# Patient Record
Sex: Male | Born: 1940 | Race: White | Hispanic: No | Marital: Married
Health system: Midwestern US, Community
[De-identification: ages and names within clinical notes are randomized; demographics above are authoritative.]

---

## 2017-05-30 IMAGING — CT CT Maxillofacial Area W-O Contrast
1 series · 15 of 30 positions shown, 19 images · non-contrast
Comparison: None

CT Maxillofacial Area W-O Contrast
INDICATION: Trauma.                                                                      
 Pertinent History: Ground level fall. Patient landed on his face hitting his nose.        
 Surgical History:                                                                         
 Cancer: None                                                                              
 Comments: None
TECHNIQUE: Helical acquisition with sagittal and coronal reformations.                    
 Utilized dose reduction techniques include: Automated Exposure Control, vendor specific   
 iterative reconstruction technique

[Series 7: ax pre st facial bones · axial · non-contrast · 0.43mm/px · z∈[+1279,+1441]mm · 15 of 89 slices shown, 19 images]
[im 4/89  brain]
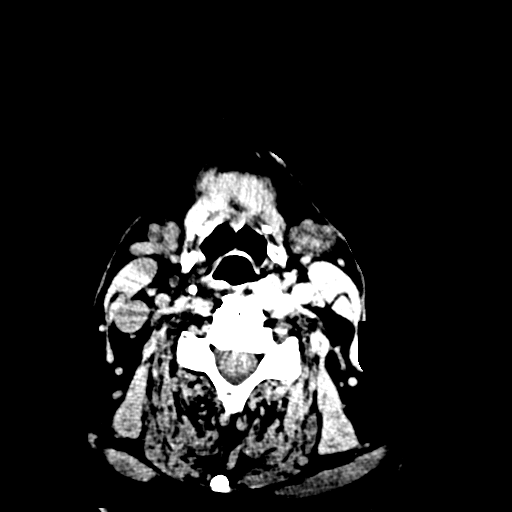
[im 4/89  bone]
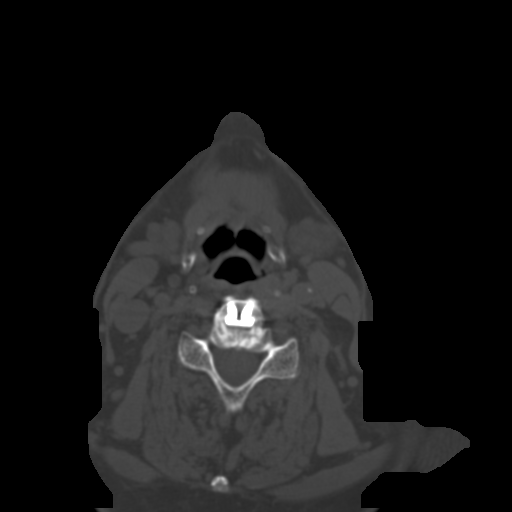
[im 10/89  bone]
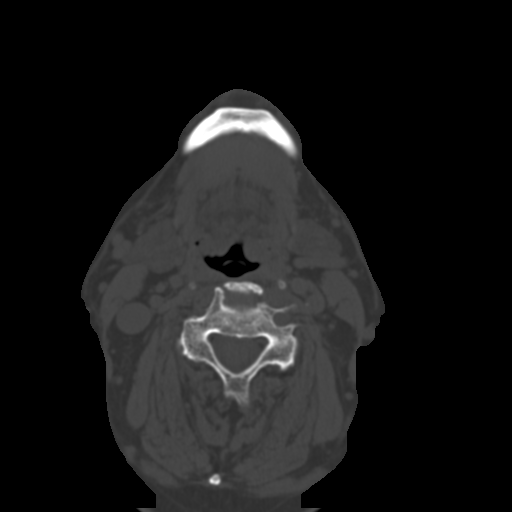
[im 16/89  bone]
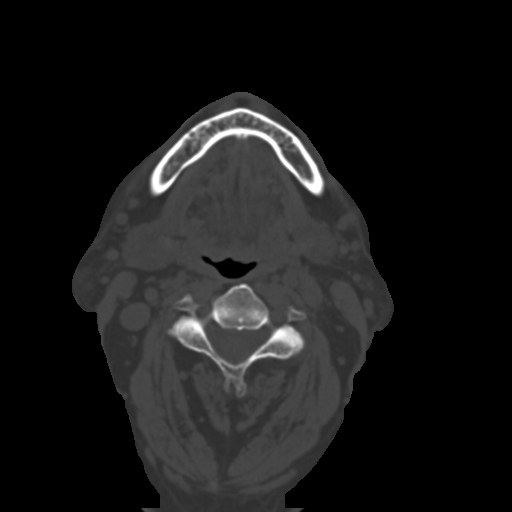
[im 22/89  bone]
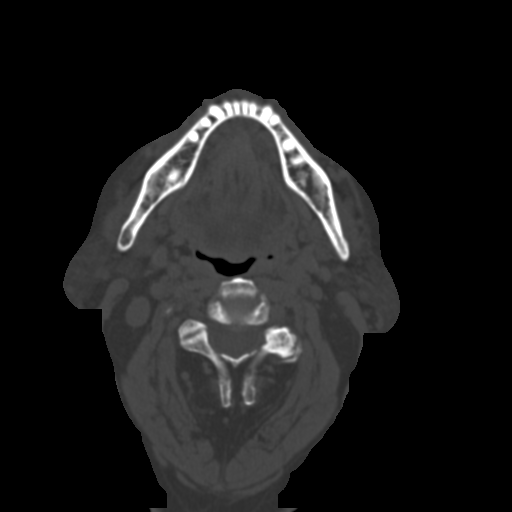
[im 28/89  brain]
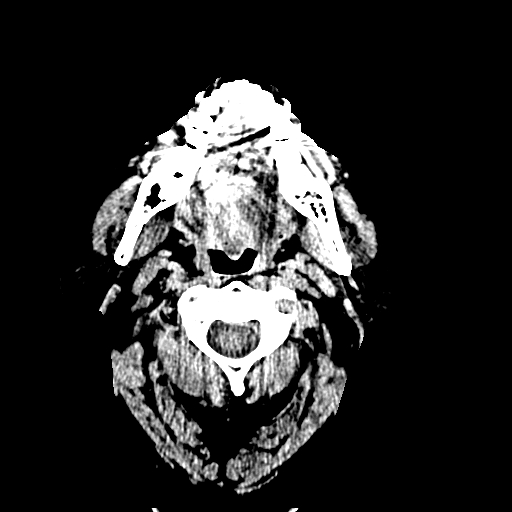
[im 28/89  bone]
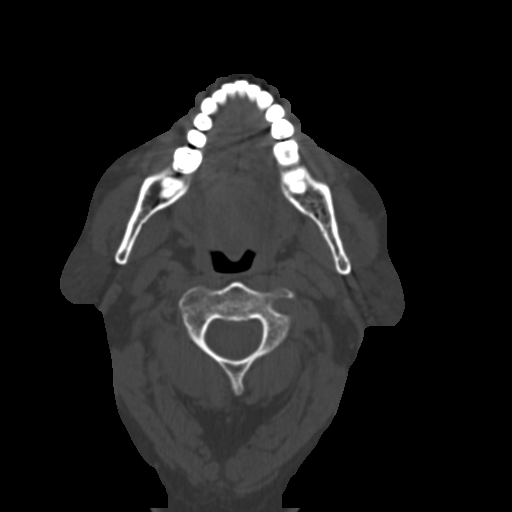
[im 34/89  bone]
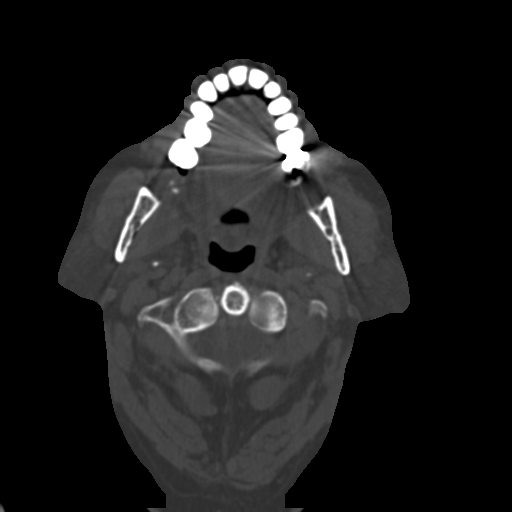
[im 40/89  bone]
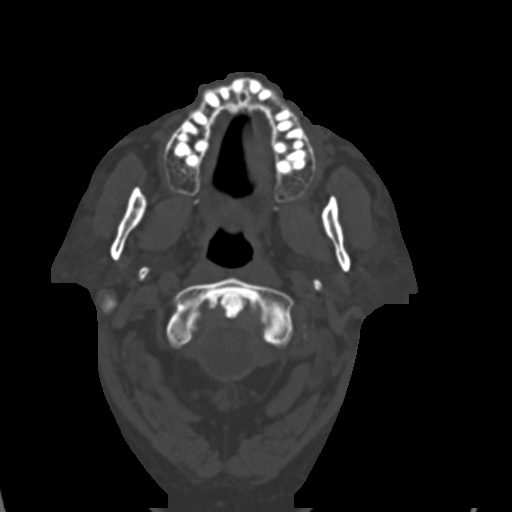
[im 46/89  bone]
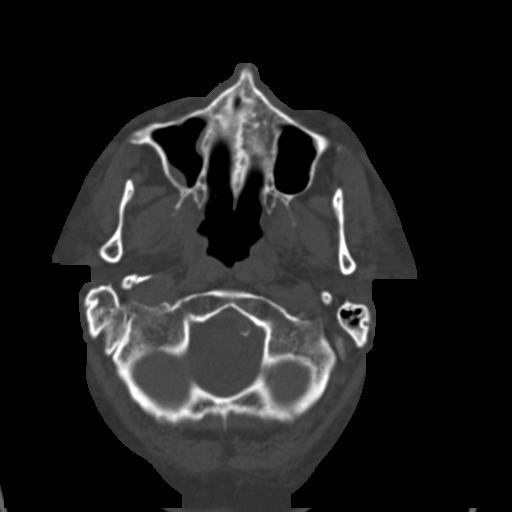
[im 49/89  brain]
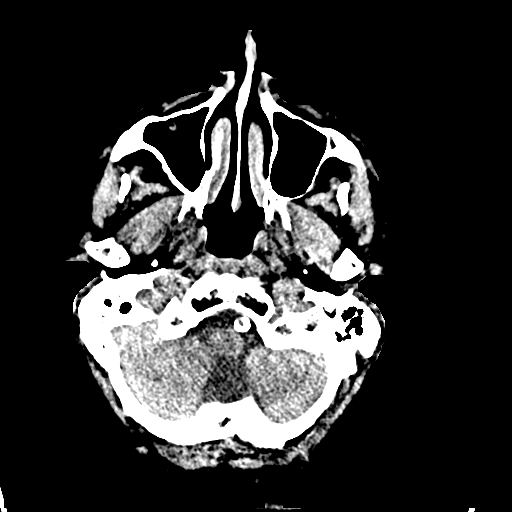
[im 49/89  bone]
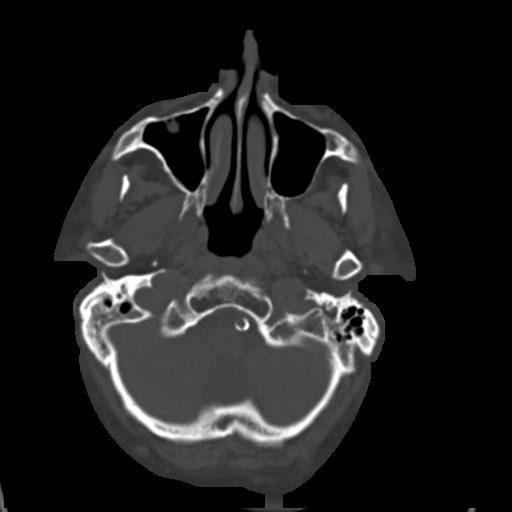
[im 55/89  bone]
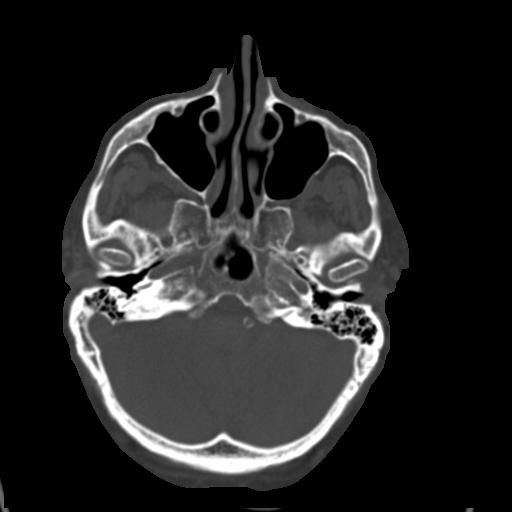
[im 61/89  bone]
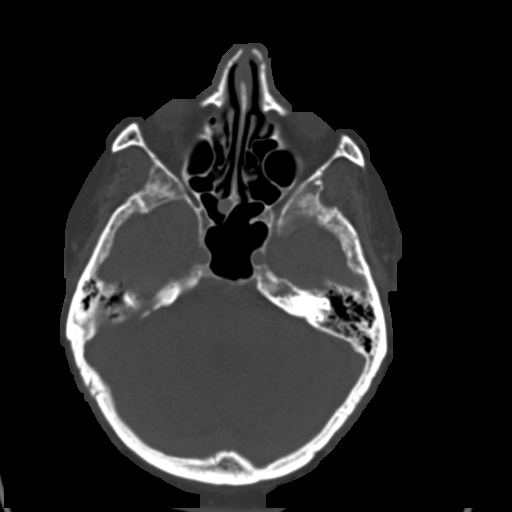
[im 67/89  bone]
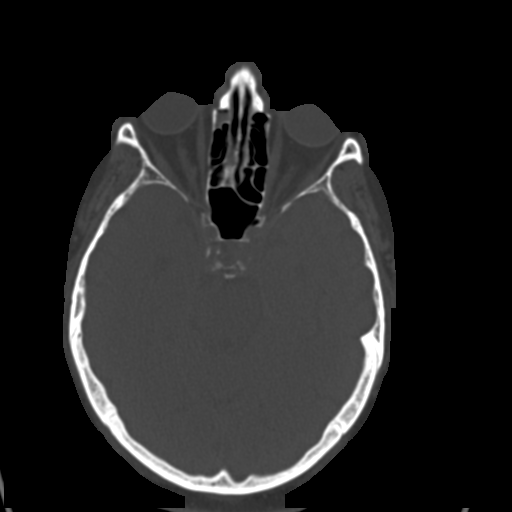
[im 73/89  brain]
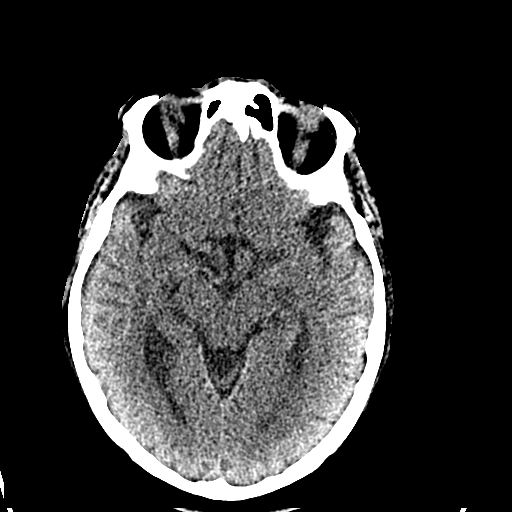
[im 73/89  bone]
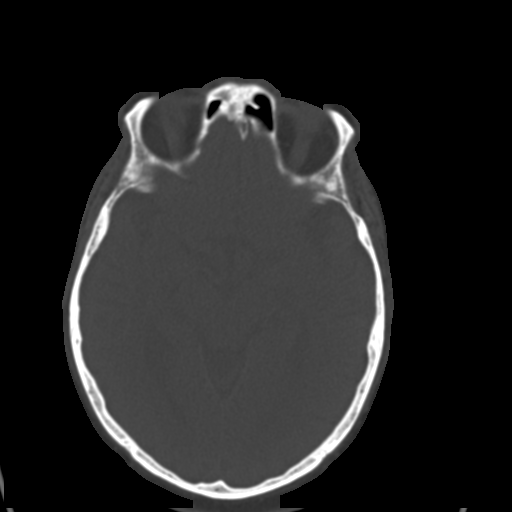
[im 79/89  bone]
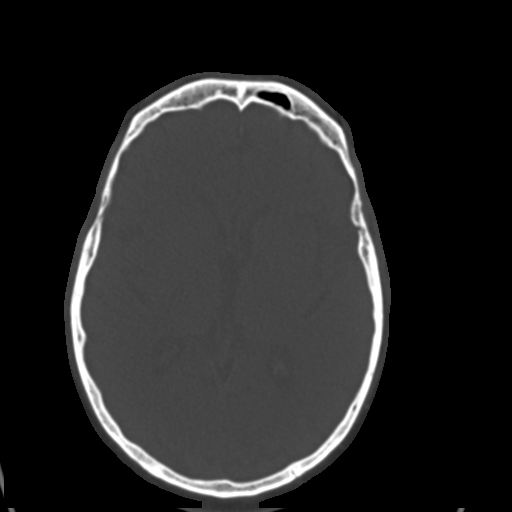
[im 85/89  bone]
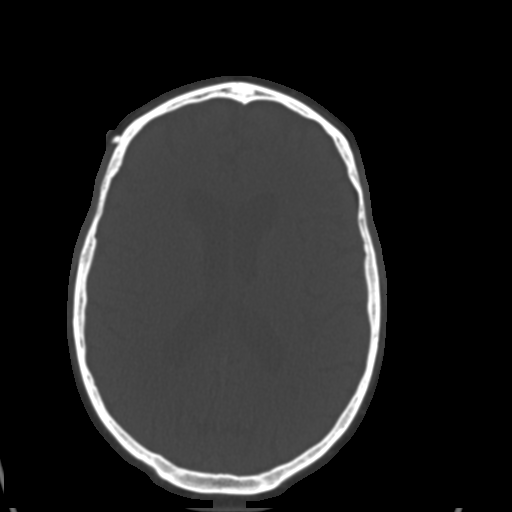

[15 of 30 positions shown; findings below may reference images not displayed]

FINDINGS: Suggestion of nondisplaced fracture of the anterior aspect of the right nasal   
 bone. Leftward deviation of the inferior aspect of the nasal septum on atraumatic basis.  
 Mildly displaced and comminuted fracture of the alveolar ridge of the maxilla.            
 No retrobulbar hematoma formation. Prior bilateral ocular lens surgery.                   
 The zygomatic arches, orbits, pterygoid plates and the mandible are all intact without    
 fracture or dislocation. Mild mucosal thickening within bilateral ethmoid air cells the   
 right sphenoid sinus and both maxillary sinuses. Remainder of the visualized paranasal    
 sinuses and mastoid air cells are essentially clear.
IMPRESSION: Suggestion of nondisplaced fracture of the anterior aspect of the right nasal bone.       
 Comminuted mildly displaced fracture of the alveolar process of the maxilla.

## 2017-12-03 IMAGING — CR DX Chest X-Ray Portable
1 series · 1 of 1 positions shown · non-contrast
Comparison: 04/22/17, 01/29/08

Portable Upright frontal chest
INDICATION: Chest pain today

[view not recorded]
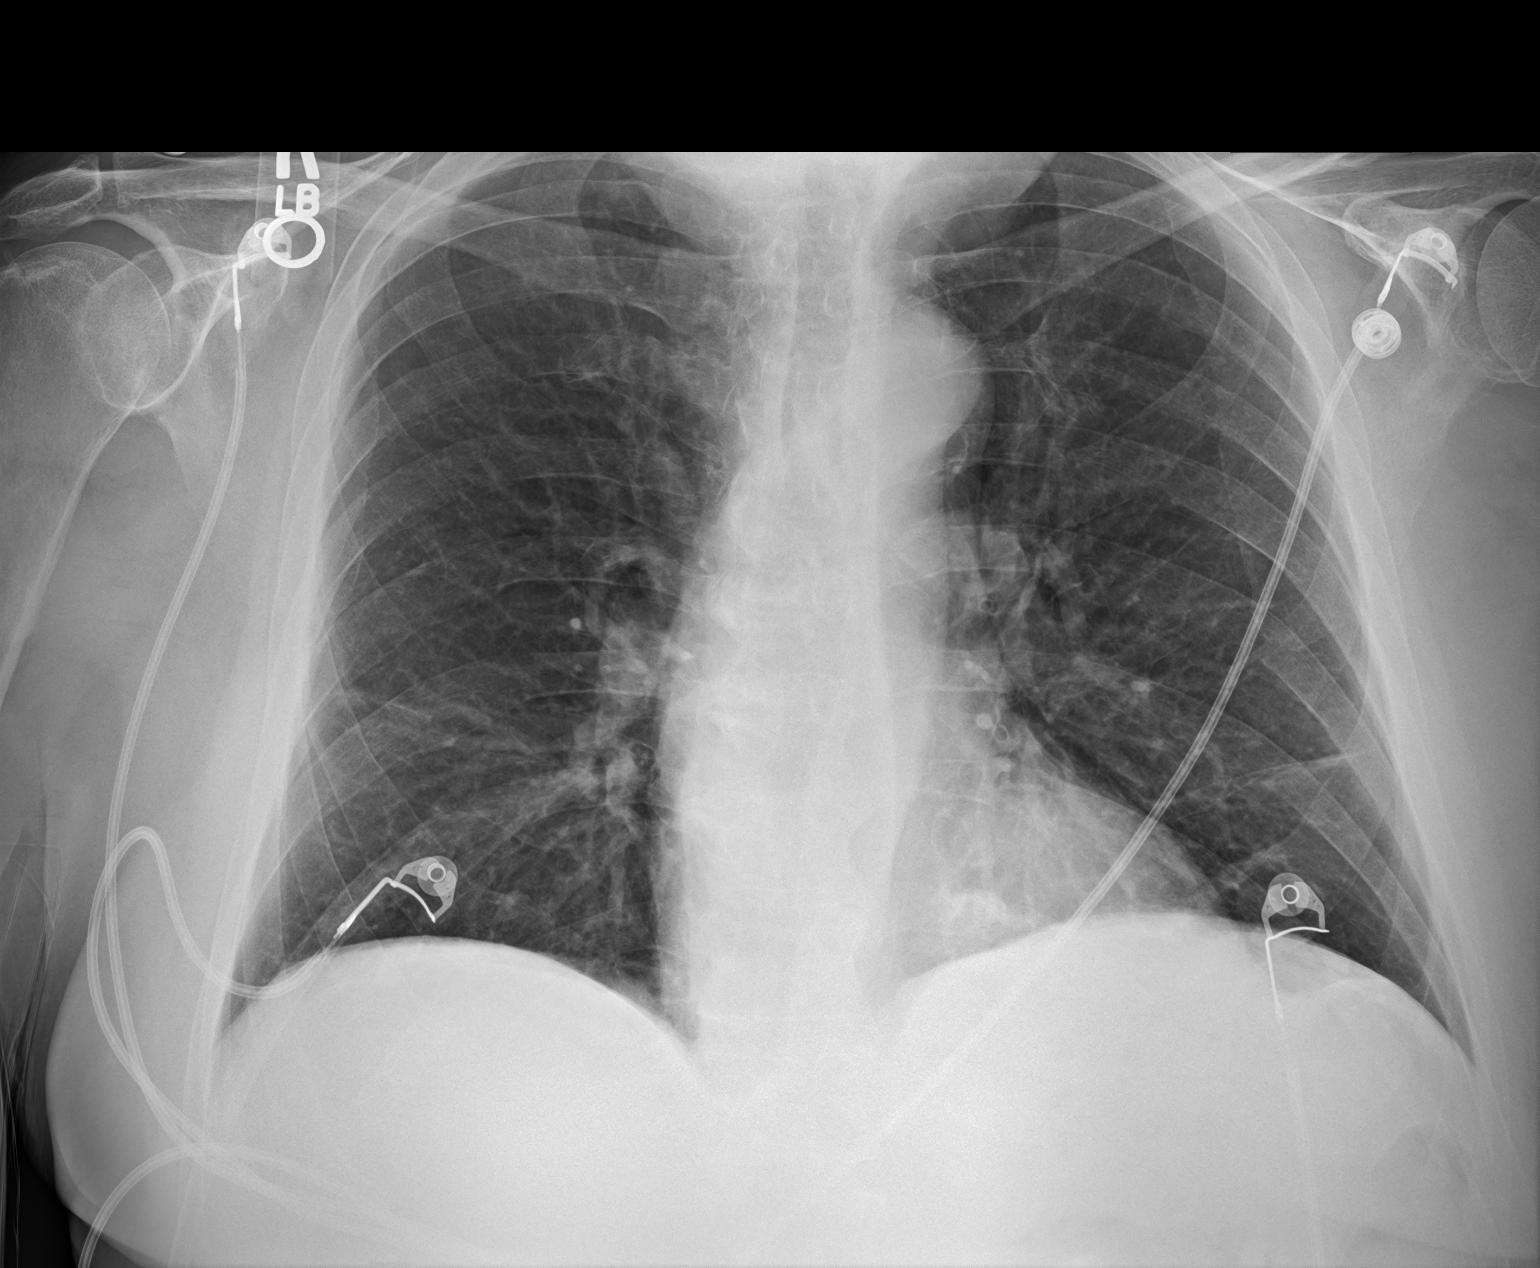

[1 of 1 positions shown; findings below may reference images not displayed]

FINDINGS: The cardiac silhouette is within normal limits. Linear atelectasis/scar in the  
 right midlung and left lung base. No sizable pleural effusion or pneumothorax. Minimal    
 thoracic spondylosis. Vascular calcification.
IMPRESSION: No acute pulmonary process.

## 2019-03-03 IMAGING — CT CT Abdomen and Pelvis W-Contrast
2 of 3 series · 15 of 46 positions shown, 17 images · IV contrast (Omnipaque)
Comparison: CT abdomen and pelvis July 20, 2014

CT Abdomen and Pelvis W-Contrast
INDICATION: Abdominal Pain.                                                              
 Pertinent History: Left sided abdominal pain with vomiting since last night               
 Surgical History:                                                                         
 Cancer: Skin- surgical intervention only                                                  
 GFR (past 30 days): 30-39 ml/min                                                          
 Metformin: None                                                                           
 Lipase: N/A       Amylase: N/A      WBC: N/A                                              
 Intravenous contrast: 100 mL Omnipaque 350                                                
 Oral contrast: No                                                                         
 Comments: None
TECHNIQUE: Routine with IV contrast. Helical acquisition with sagittal and coronal        
 reformations; post IV contrast images.                                                    
 Utilized dose reduction techniques include: Automated Exposure Control, vendor specific   
 iterative reconstruction technique

[Series 4: ax post · axial · 0.81mm/px · z∈[+377,+812]mm · 12 of 101 slices shown, 14 images]
[im 7/101  soft-tissue]
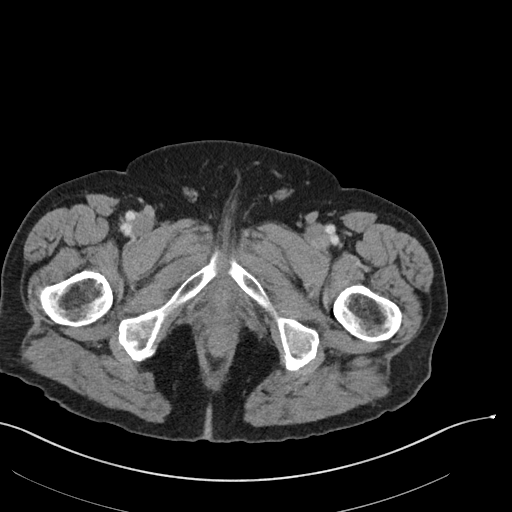
[im 7/101  bone]
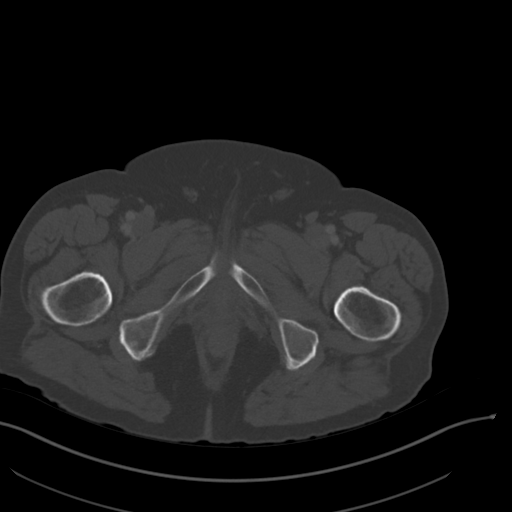
[im 13/101  soft-tissue]
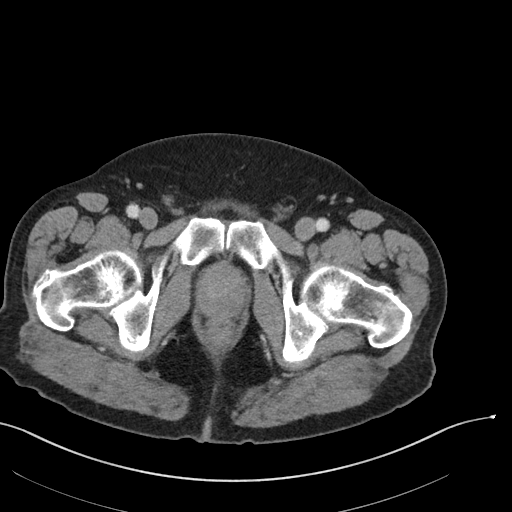
[im 23/101  soft-tissue]
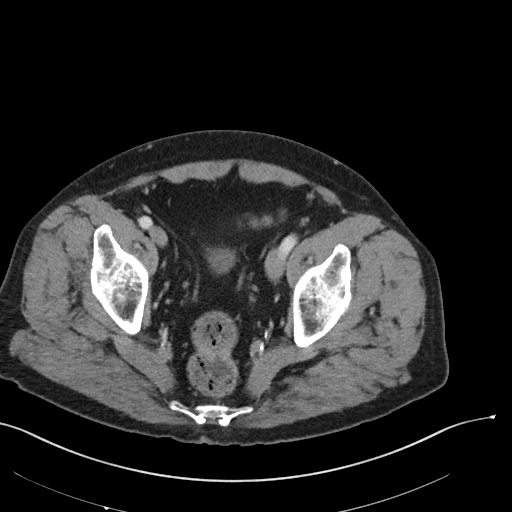
[im 30/101  soft-tissue]
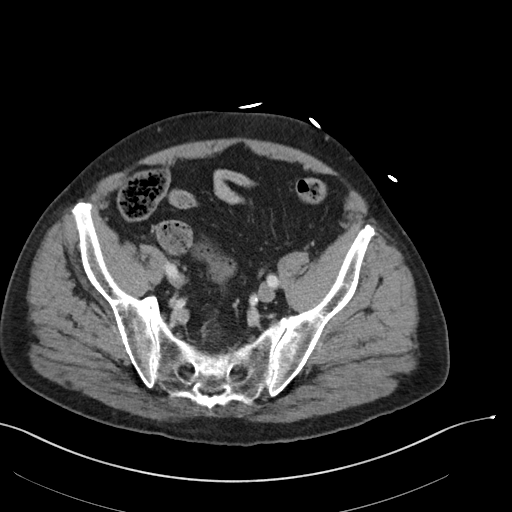
[im 39/101  soft-tissue]
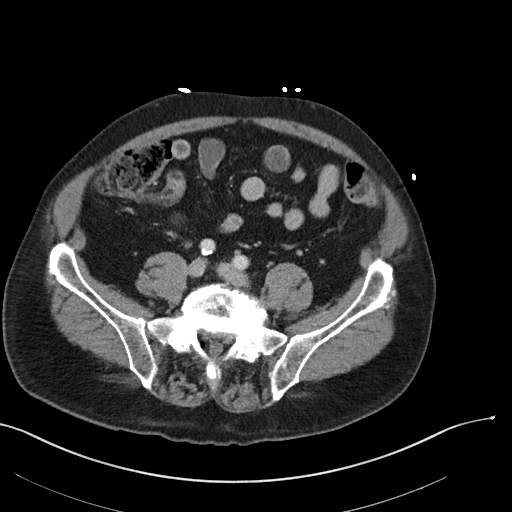
[im 46/101  soft-tissue]
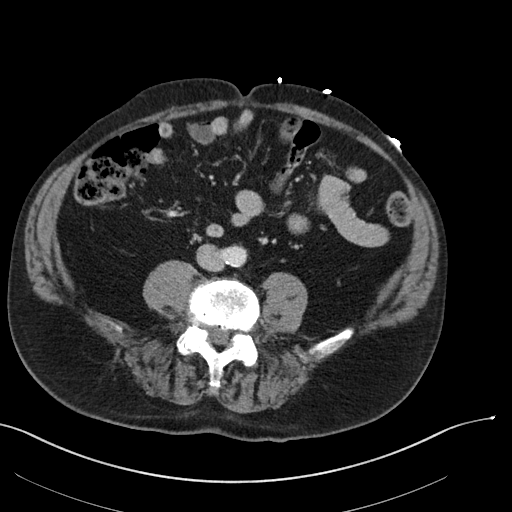
[im 55/101  soft-tissue]
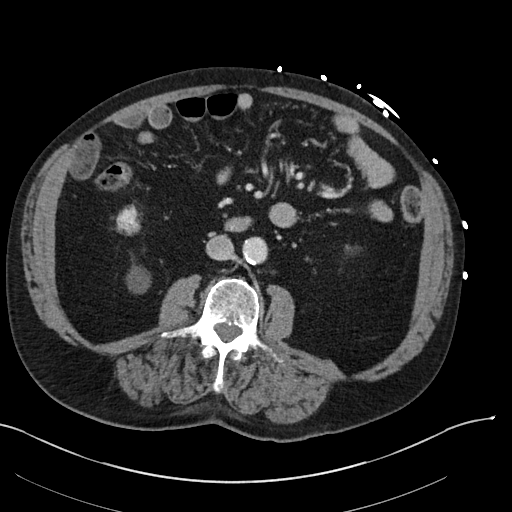
[im 62/101  soft-tissue]
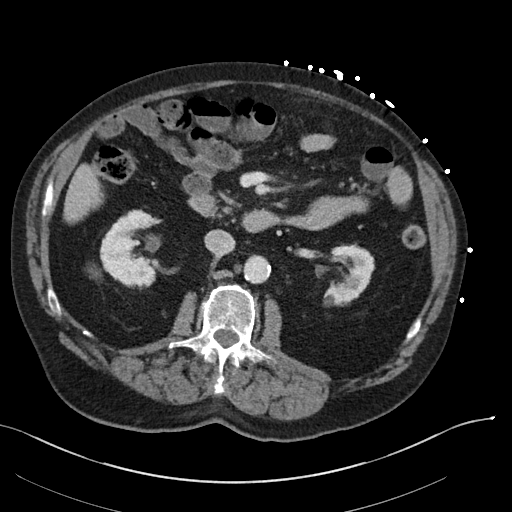
[im 71/101  soft-tissue]
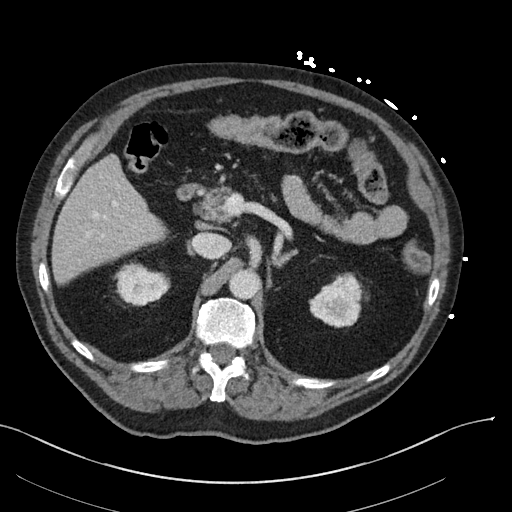
[im 71/101  bone]
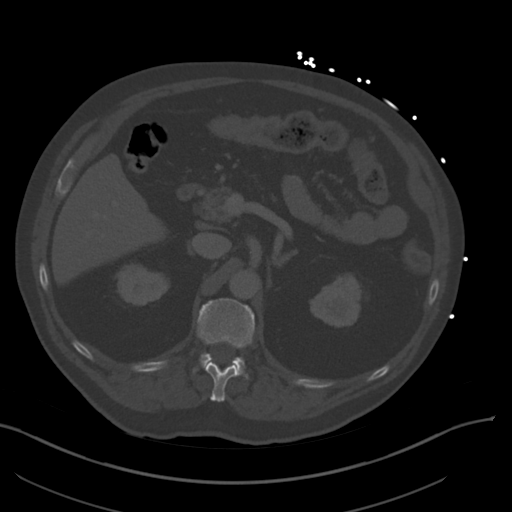
[im 78/101  soft-tissue]
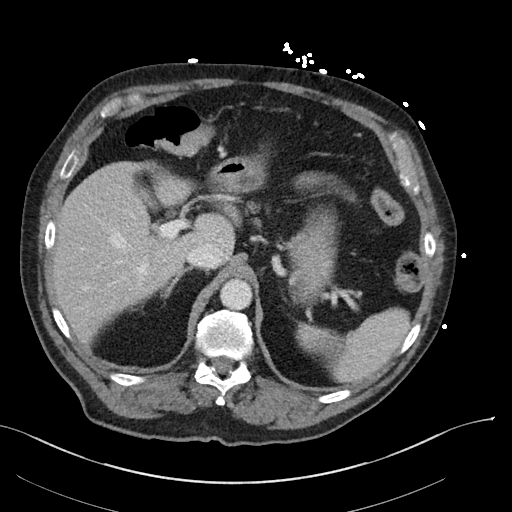
[im 88/101  soft-tissue]
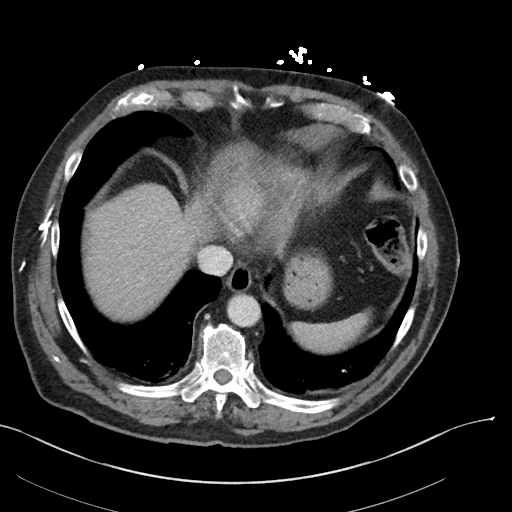
[im 94/101  soft-tissue]
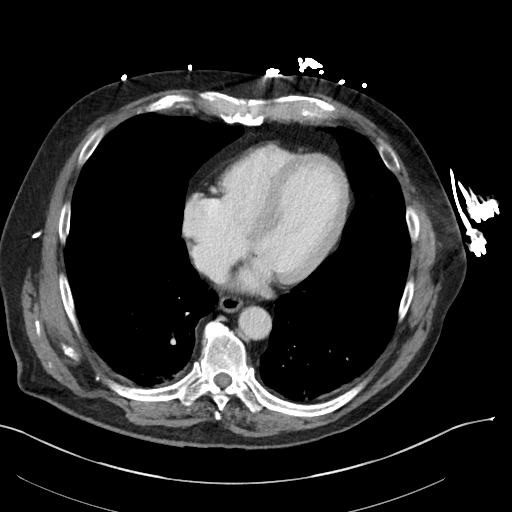

[Series 5: cor post · coronal · 0.82mm/px · 3 of 62 slices shown]
[im 21/62  soft-tissue]
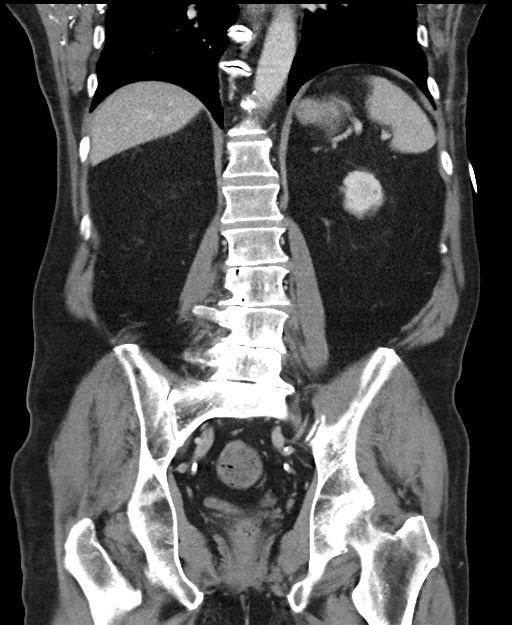
[im 28/62  soft-tissue]
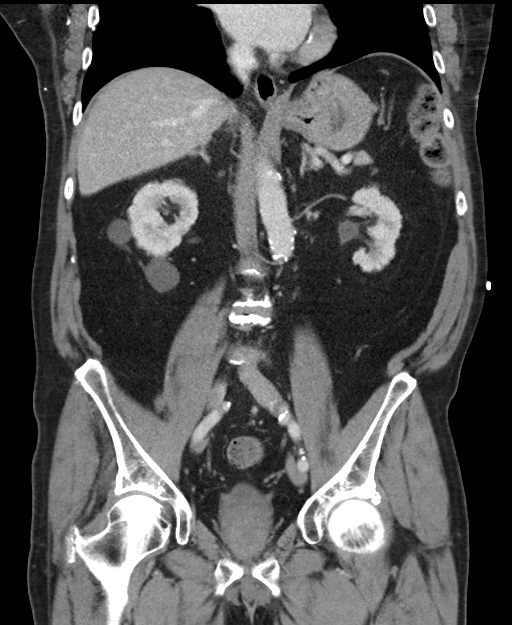
[im 34/62  soft-tissue]
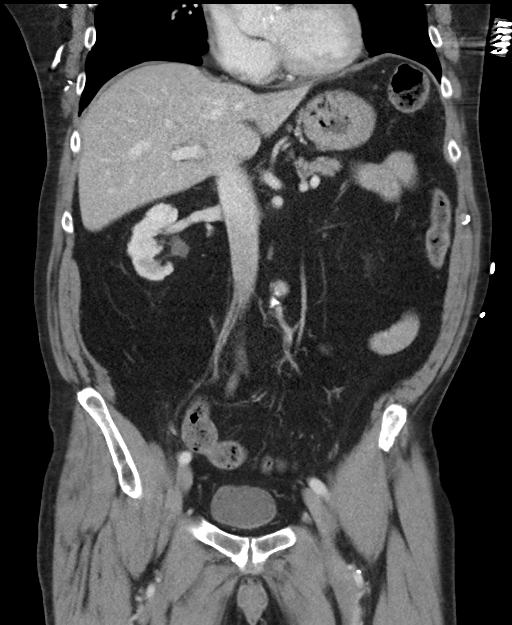

[15 of 46 positions shown; findings below may reference images not displayed]

FINDINGS: LOWER CHEST: Mild bibasilar atelectasis.                                                  
 HEPATOBILIARY: Normal liver. Nondilated bile ducts. Surgically absent                     
 PANCREAS: Normal.                                                                         
 SPLEEN: Normal.                                                                           
 ADRENAL GLANDS: Normal.                                                                   
 URINARY TRACT: Small bilateral renal cortical cysts. Largest in the inferior pole of the  
 right kidney measures 2.8 cm. No hydronephrosis. Ureters nondilated. Nondistended         
 bladder.                                                                                  
 VASCULATURE: Moderate aortic/arterial atherosclerotic plaque. Mild aneurysmal dilation of 
 the common iliac artery on the left with mural thrombus measures approximately 1.6 cm in  
 transverse dimension. This is unchanged from prior exam.                                  
 LYMPH NODES AND RETROPERITONEUM: No lymphadenopathy.                                      
 PERITONEUM: No ascites or free air.                                                       
 GASTROINTESTINAL: Nondistended stomach. No small bowel obstruction. Unremarkable          
 colon.                                                                                    
 APPENDIX: Not seen                                                                        
 PELVIC ORGANS: Enlarged prostate measures 4.8 x 4.0 cm.                                   
 MUSCULOSKELETAL: Intact abdominal wall. Moderate multilevel degenerative disc disease     
 present throughout the lumbar spine. Shallow disc bulge at multiple levels results in     
 mild to moderate central canal stenosis. Multilevel neural foramen stenosis is most       
 severe at the L4-L5 and L5-S1 levels on the left. Mild to moderate degenerative changes   
 of the hips.
IMPRESSION: 1.  No acute intra-abdominal process identified.                                          
 2.  Atherosclerotic disease with stable small left common iliac artery aneurysm.          
 3.  Prostate enlargement.

## 2019-03-03 IMAGING — CR DX Chest X-Ray Portable
1 series · 1 of 1 positions shown · non-contrast
Comparison: 12/03/2017

DX Chest X-Ray Portable
HISTORY: Abdominal pain
TECHNIQUE: Portable chest upright, 1 view

[chest ap vg]
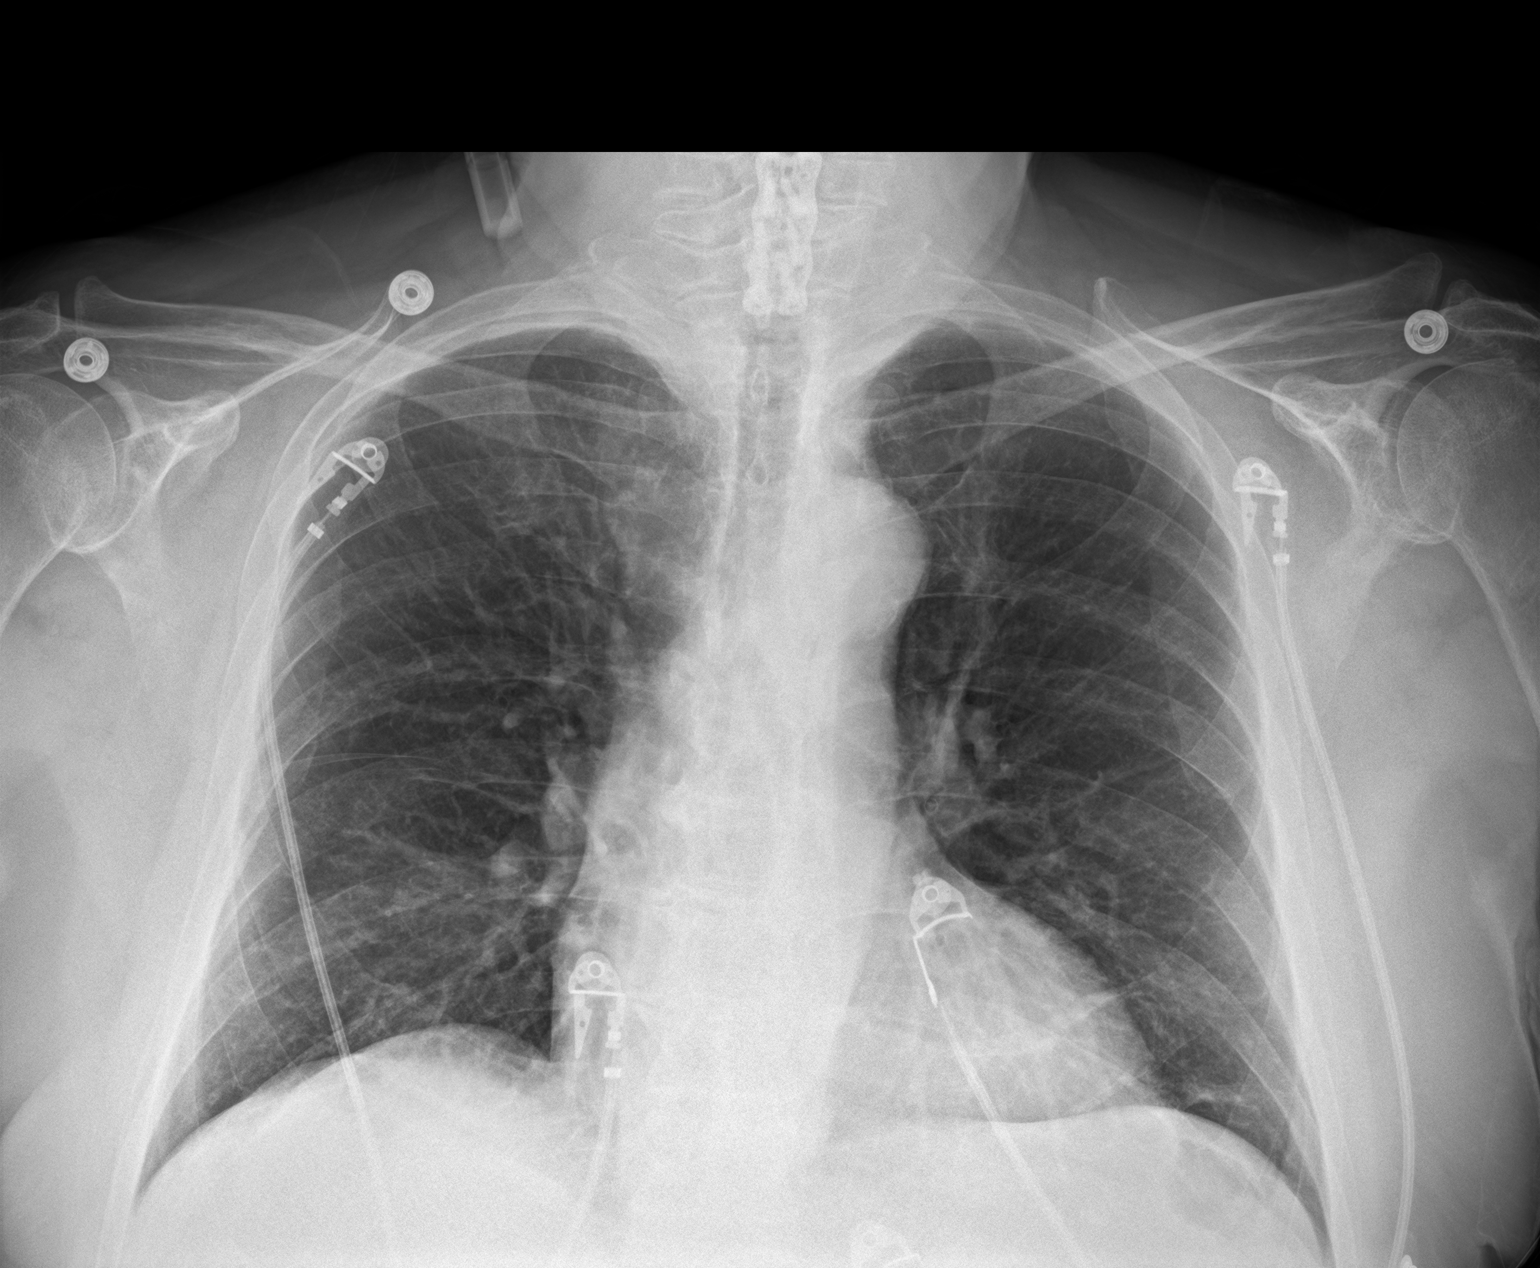

[1 of 1 positions shown; findings below may reference images not displayed]

FINDINGS: Thin linear atelectasis/scarring are noted at the lung bases bilaterally. No definite     
 focal airspace consolidation is appreciated.                                              
 The cardiomediastinal silhouette size is probably unchanged. There is no pneumothorax or  
 large pleural effusion appreciated.                                                       
 Cervical postsurgical changes are present.
IMPRESSION: No definite acute cardiopulmonary process appreciated.

## 2019-10-12 IMAGING — CR DX Chest X-Ray Portable
1 series · 1 of 1 positions shown · non-contrast
Comparison: Chest radiographs from 03/03/2019

DX Chest X-Ray Portable                                                                   
 CLINICAL INDICATION:   Chest Pain                                                         
 Single view is submitted.

[chest ap vg]
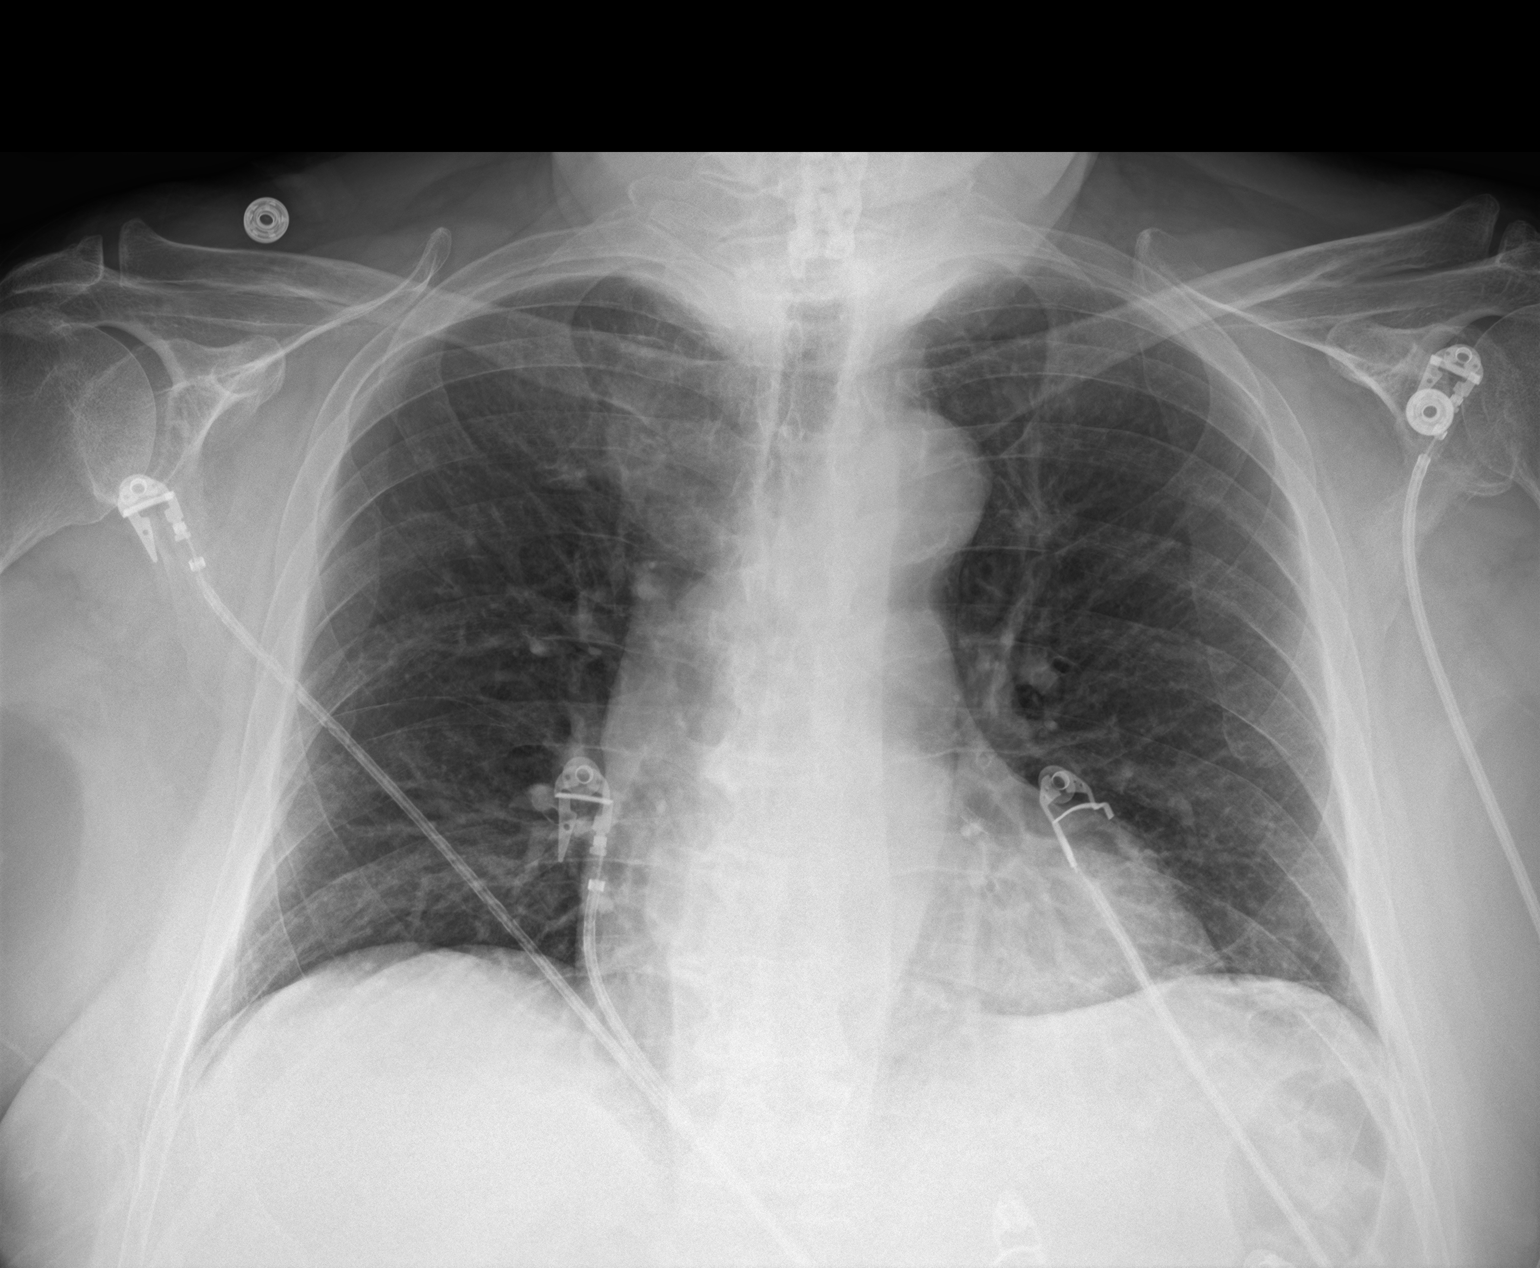

[1 of 1 positions shown; findings below may reference images not displayed]

FINDINGS: No focal consolidation, pleural effusion, or pneumothorax. Linear regions of scarring     
 again noted within the left lung base.                                                    
 The heart is normal in size. Pulmonary vascularity is unremarkable.                       
 No acute osseous abnormalities.
IMPRESSION: No acute disease.

## 2020-11-18 IMAGING — CR DX Chest X-Ray Portable
1 series · 1 of 1 positions shown · non-contrast
Comparison: October 12, 2019.

Portable AP chest November 18, 2020.
HISTORY: Chest pain with headache.

[chest ap vg]
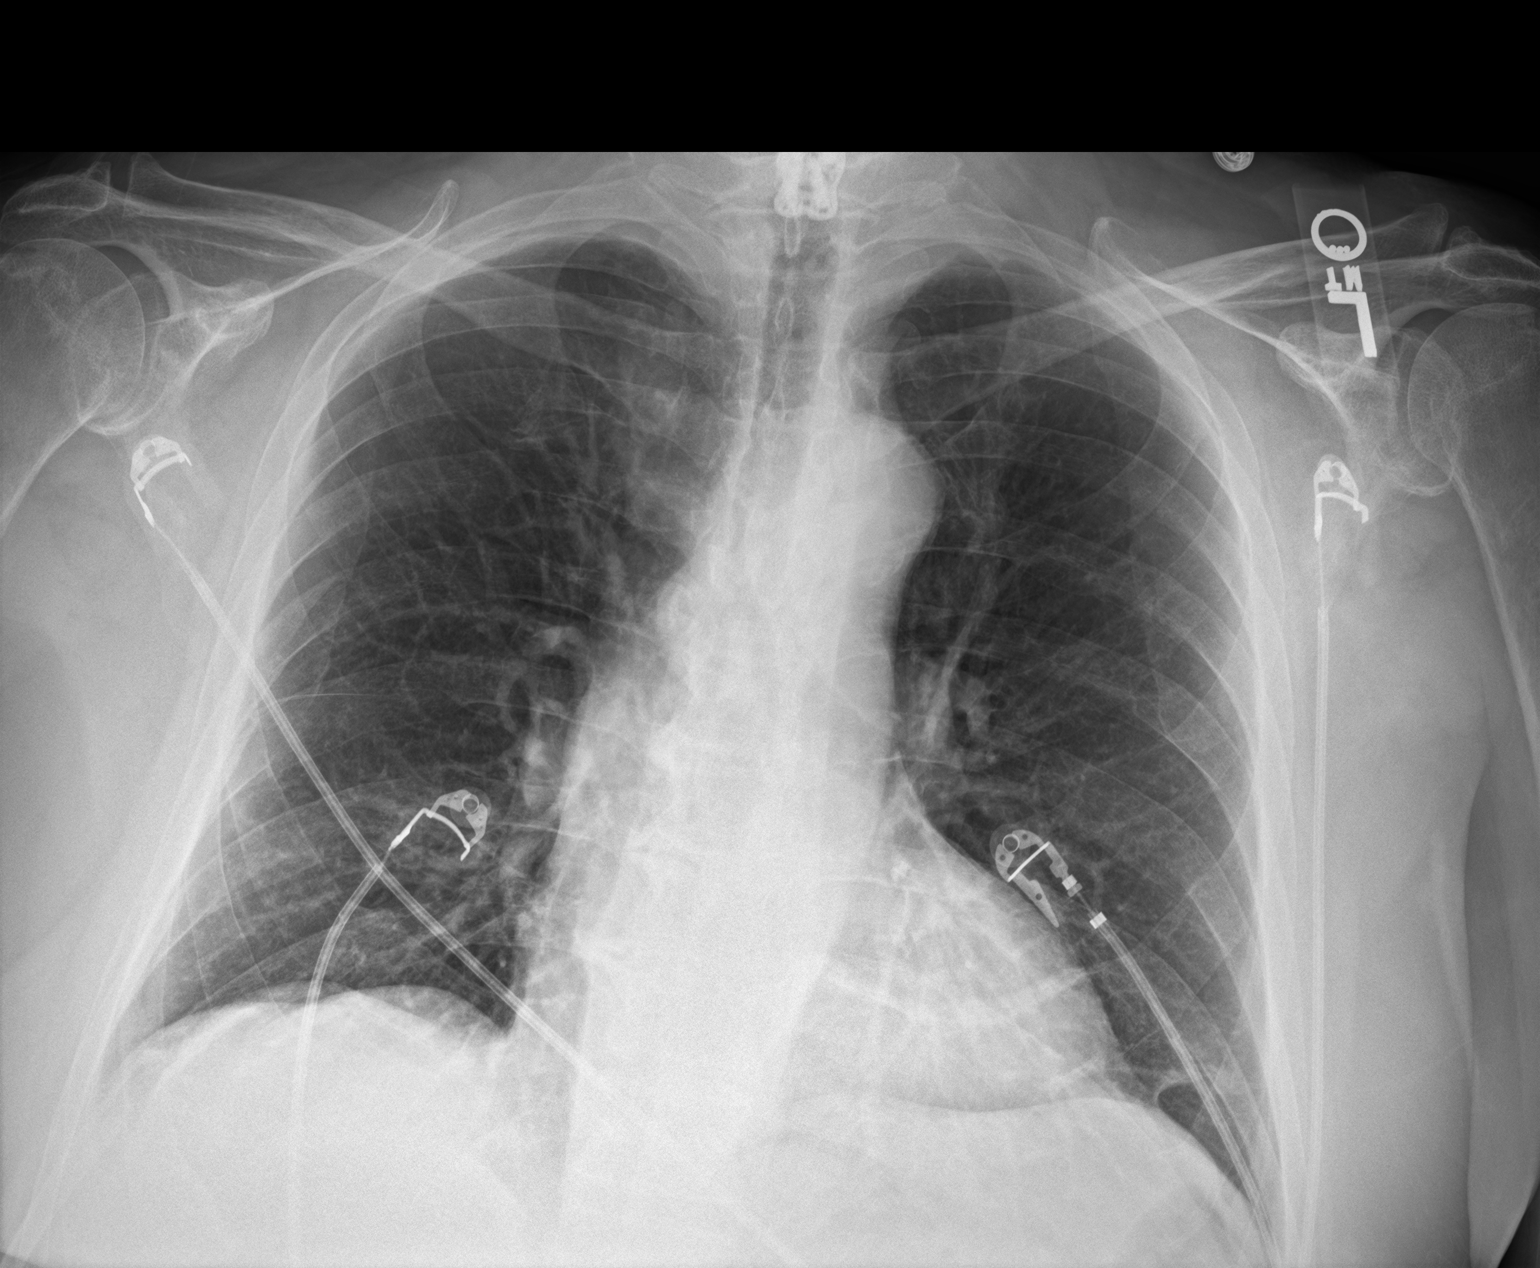

[1 of 1 positions shown; findings below may reference images not displayed]

Portable AP erect image of the chest is submitted. Heart size is normal. The pulmonary    
 vascularity is normal. Slight atelectasis at both lung bases. No confluent infiltrates,   
 pleural effusions or pneumothorax. Hardware in the lower cervical spine is stable         
 appearing. Multilevel degenerative disc disease is present in the thoracic                
 spine.
IMPRESSION: No acute pulmonary disease.

## 2020-11-19 IMAGING — CT NM Myocard Perfusion Spect (Stress)
1 series · 14 of 16 positions shown, 18 images · non-contrast
Comparison: CT scan of the chest on April 22, 2017.

EXAMINATION: MYOCARDIAL IMAGING (REST/PHARMACOLOGIC-STRESS/SPECT/WITH GATED IMAGING AND   
 EJECTION FRACTION MEASUREMENT)
CLINICAL HISTORY: Chest Pain. MI/STENT, ANEURYSM THORACIC AORTA, ASHD, BPH, CAD, DM,     
 CKD, DIZZY, ESOPHAGEAL VARICES, HYPOTHYROID, OSA/CPAP, PVC, GOUT.                         
 BMI: 30
TECHNIQUE: A one day study was performed.                                                
 Stress Test:  According to the Cardiologist's report the patient was given a rapid        
 injection (approximately 10 sec) of Regadenoson 0.4 mg/5ml IV, followed immediately by    
 5ml saline flush. The patient's baseline heart rate was 58 BPM with a maximum heart rate  
 of 75 BPM. The baseline blood pressure was 135/77 mmHg and maximum blood pressure was     
 165/93 mmHg. The blood pressure response to Regadenoson was appropriate. The resting ECG  
 showed normal sinus rhythm w/ PRW transition. The ECG during stress showed no significant 
 ST-segment changes, no evidence of ischemia.  The patient experienced no chest pain       
 symptoms during the bolus injection.                                                      
 Nuclear Imaging: The patient was given 10.3 mCi of DcOOm Myoview IV at rest and           
 approximately 30 minutes after injection resting cardiac SPECT imaging was performed. For 
 stress imaging, ten to twenty seconds after the Regadenoson infusion and saline flush the 
 patient received 32.6 mCi of DcOOm Myoview IV and approximately 30-45 minutes post        
 injection cardiac gated SPECT imaging was performed.  Nondiagnostic CT images were        
 obtained for attenuation correction purposes.

[Series 6: ac bh stress cardiac 5.0 b31s · axial · 0.98mm/px · z∈[+1296,+1476]mm · 14 of 42 slices shown, 18 images]
[im 3/42  soft-tissue]
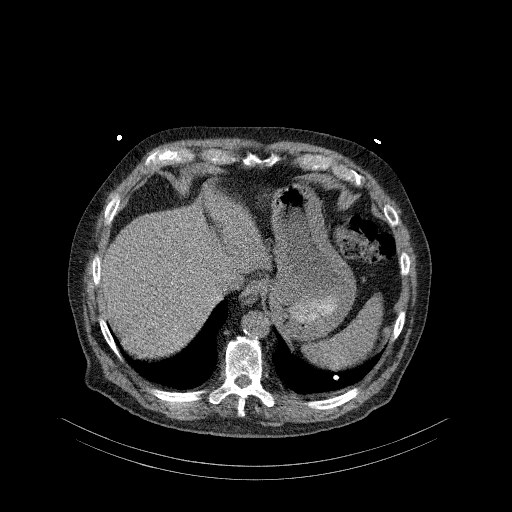
[im 3/42  bone]
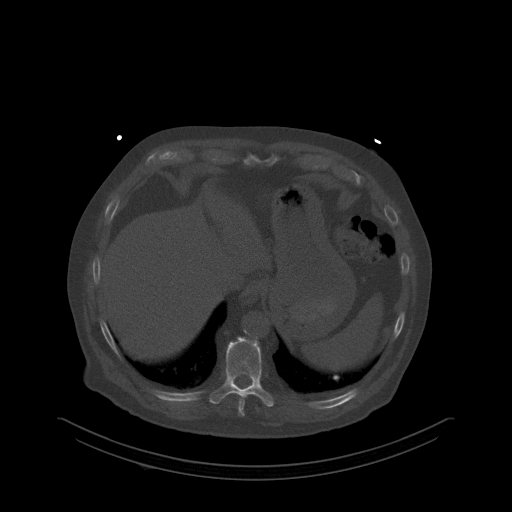
[im 6/42  soft-tissue]
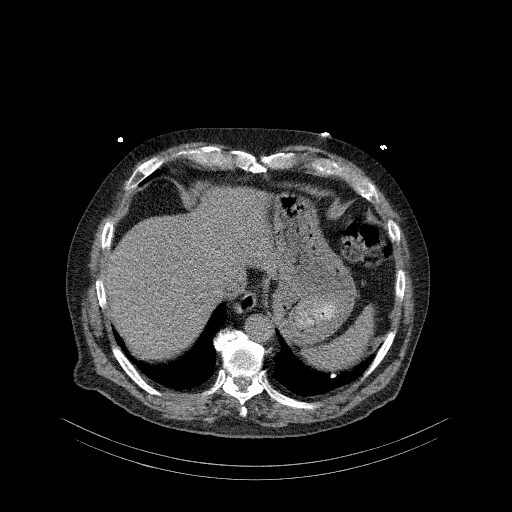
[im 9/42  soft-tissue]
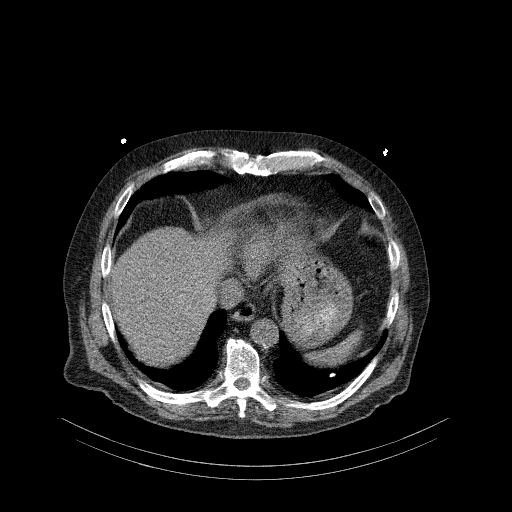
[im 11/42  soft-tissue]
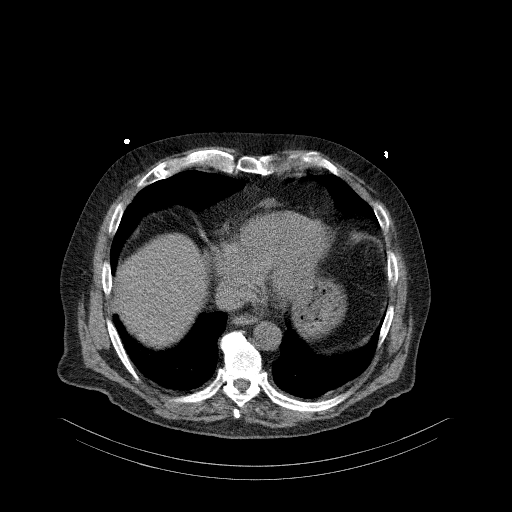
[im 14/42  soft-tissue]
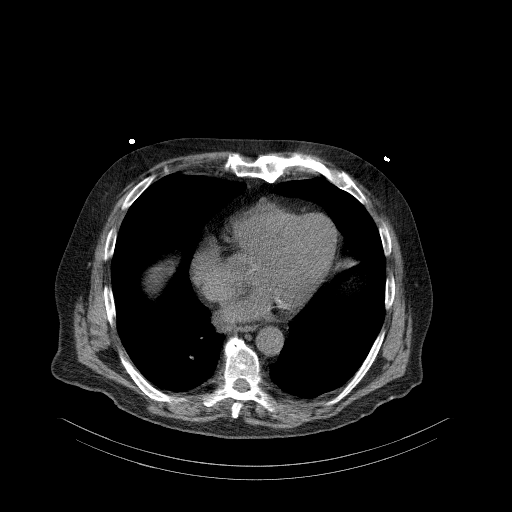
[im 14/42  bone]
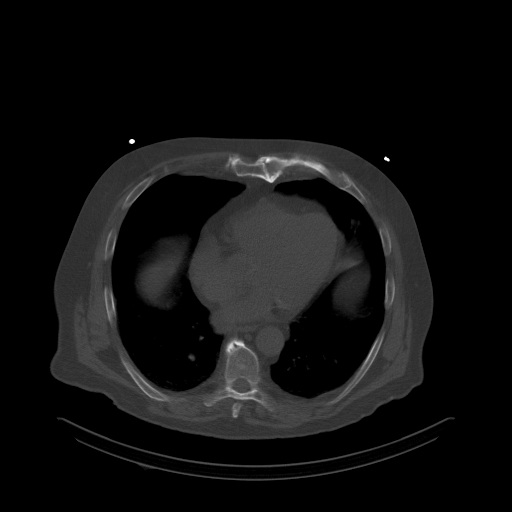
[im 17/42  soft-tissue]
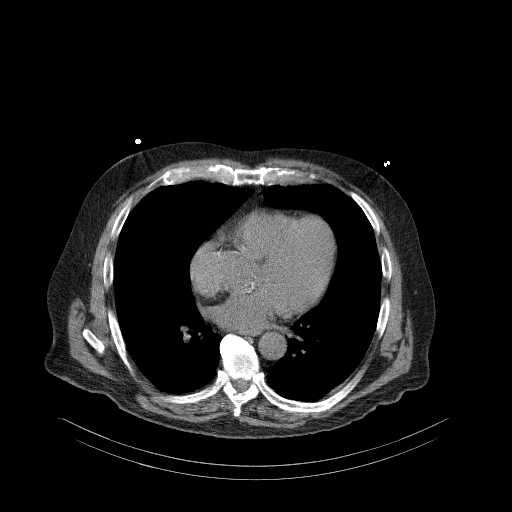
[im 20/42  soft-tissue]
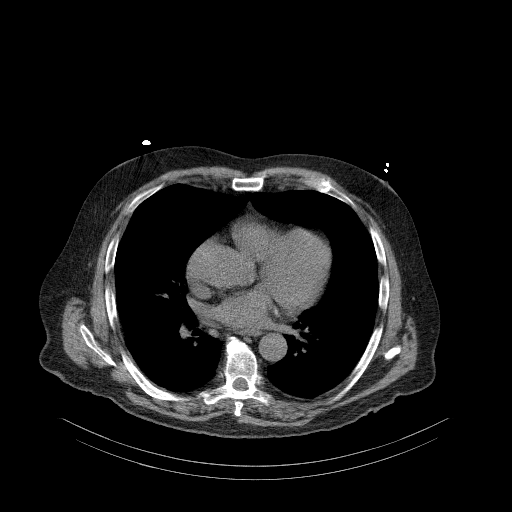
[im 22/42  soft-tissue]
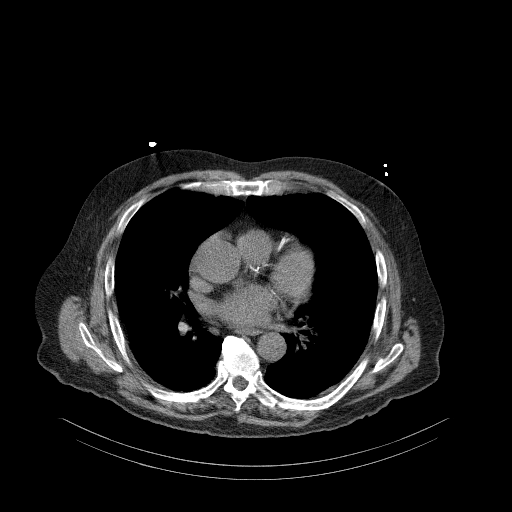
[im 25/42  soft-tissue]
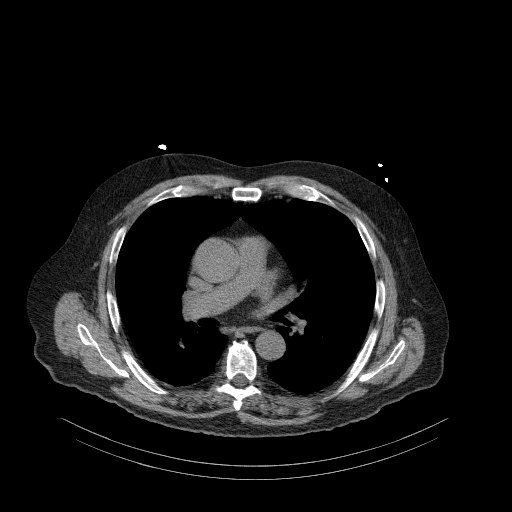
[im 25/42  bone]
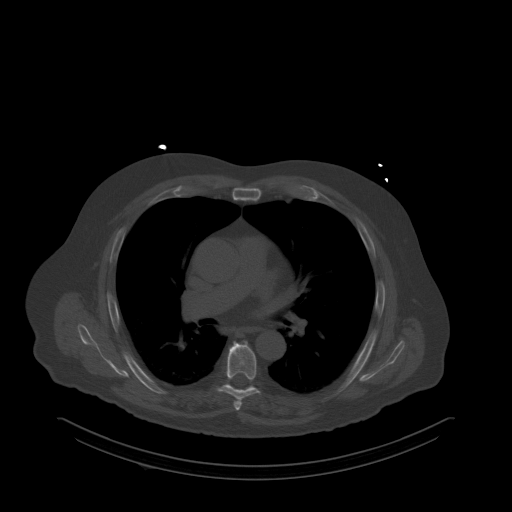
[im 28/42  soft-tissue]
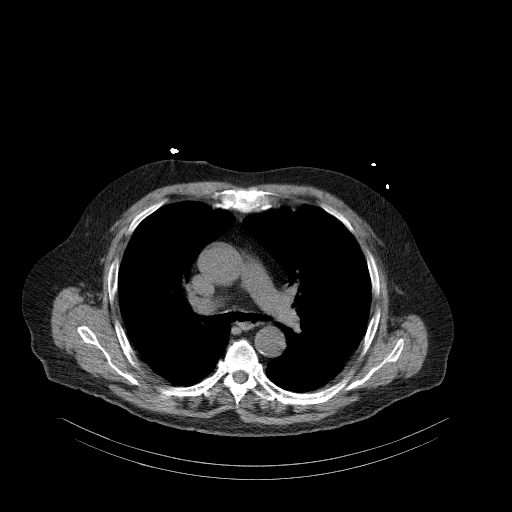
[im 31/42  soft-tissue]
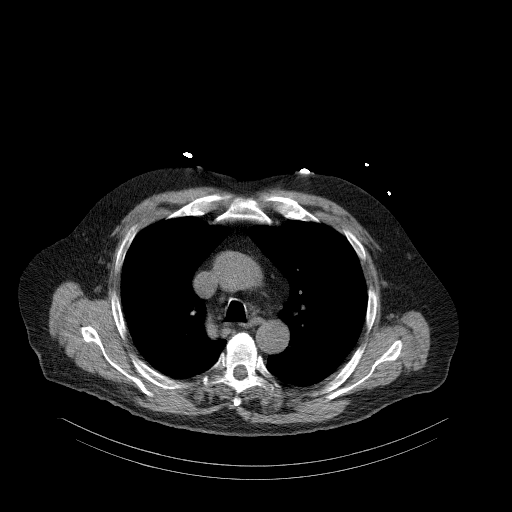
[im 33/42  soft-tissue]
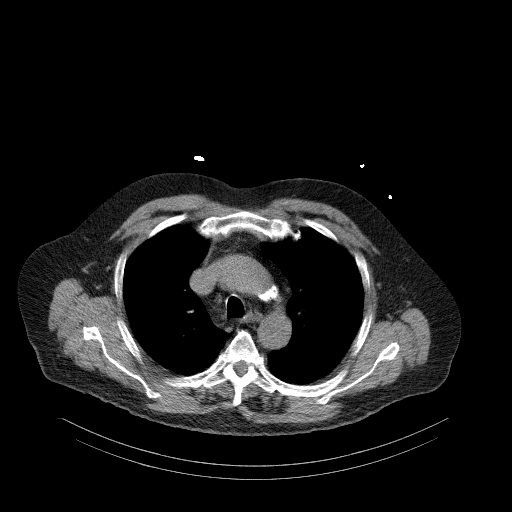
[im 36/42  soft-tissue]
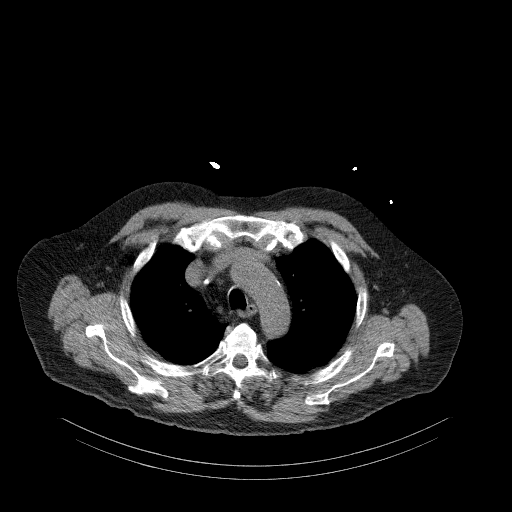
[im 36/42  bone]
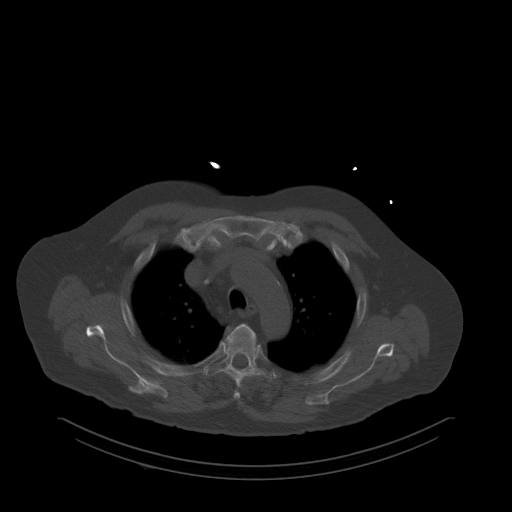
[im 39/42  soft-tissue]
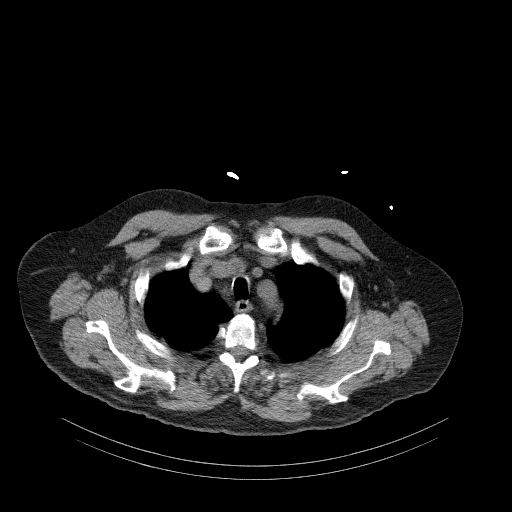

[14 of 16 positions shown; findings below may reference images not displayed]

FINDINGS: There is a normal distribution of activity in the left ventricular myocardium on both     
 rest and pharmacologic stress images. Diaphragm attenuation is noted.                     
 Gated post-stress images demonstrate normal left ventricular wall thickening and motion.  
 The left ventricular volume is normal and the left ventricular ejection fraction is 63%.  
 End diastolic volume: 83 ml.                                                              
 End systolic volume: 30 ml.                                                               
 TID: 0.97.                                                                                
 Overall study quality is good.                                                            
 Coronary artery calcium: There is mild atherosclerotic calcification of the thoracic      
 aorta. There is moderate left and mild right coronary artery calcification. The ascending 
 thoracic aorta measures 4.1 cm. There are 2 stable punctate pulmonary nodules at the      
 right lung base, unchanged since 2894 consistent with a benign etiology.
IMPRESSION: 1. Normal rest and pharmacologic-stress myocardial perfusion.                             
 2. Normal left ventricular systolic function.                                             
 3. 4.1 cm ascending thoracic aorta, similar to the prior study.                           
 4. Moderate left and mild right coronary artery calcification.

## 2021-02-04 IMAGING — CT CT Cervical Spine W-O Contrast
3 of 5 series · 11 of 33 positions shown, 13 images · IV contrast (agent unspecified)
Comparison: none

CT Cervical Spine W-O Contrast
INDICATION: Osteoarthritis.                                                              
 Pertinent History: daily headaches x3-4 months. Pain on pt's left side, down into left    
 neck. Pt states he's had visual hallucinations for x3-4 months                            
 Surgical History: cervical fusion- 7 years ago                                            
 Cancer: skin cancer- removed from left neck 2 weeks ago                                   
 Intravenous contrast: None                                                                
 Technologist Comments: None
TECHNIQUE: Routine without IV contrast.  Helical acquisition with sagittal and coronal    
 reformats.                                                                                
 Utilized dose reduction techniques include: Automated Exposure Control, vendor specific   
 iterative reconstruction technique                                                        
 C4-C7 anterior spinal fusion with interbody spacing devices in place at C4-5 C5-6 and     
 C6-7.. Slight anterior listhesis of C3 on C4 is demonstrated on sagittal projection       
 imaging. No evidence canal mass.                                                          
 Level by level analysis in the axial projection showsat C2-3 diffuse disc bulge.          
 Left-sided facet ligamentous hypertrophy is seen with uncovertebral spurring. Left-sided  
 neural foraminal narrowing is seen as a result.                                           
 C3-4 shows facet degenerative change and uncovertebral spurring. Left-sided               
 neuroforaminal narrowing is seen. Some uncovering of the disc is identified related to    
 the degenerative anterior listhesis present.                                              
 C4-5 shows facet degenerative changes. Uncovertebral spurring is identified. Left-sided   
 neuroforaminal narrowing is seen.                                                         
 C5-6 shows left-sided neural foraminal narrowing. Facet ligamentous hypertrophy is        
 seen.                                                                                     
 C6-7 shows left-sided neural foraminal narrowing with uncovertebral spurring.             
 Postoperative changes are seen.                                                           
 C7-T1 shows degenerative disc disease. Diffuse disc bulge is seen. Disc is slightly       
 asymmetric to the right and right-sided neural foraminal narrowing is seen as a result.   
 Lung apices are clear bilaterally.

[Series 4: ax pre st · axial · non-contrast · 0.47mm/px · z∈[+867,+987]mm · 3 of 121 slices shown, 4 images]
[im 31/121  soft-tissue]
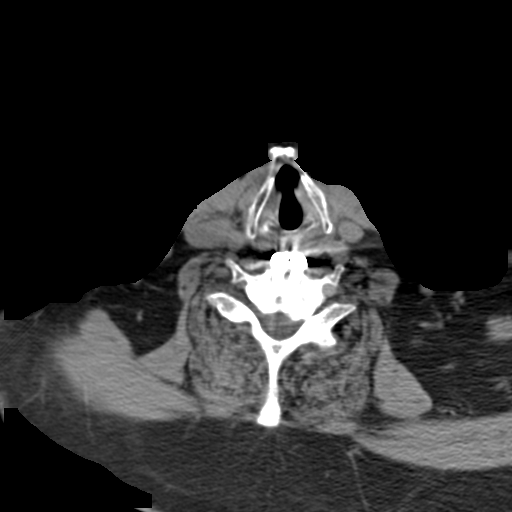
[im 31/121  bone]
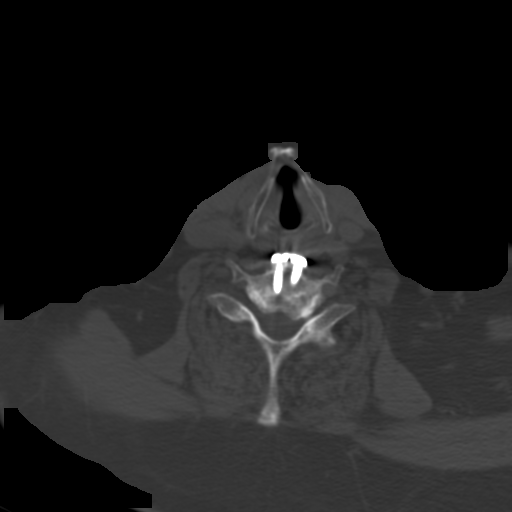
[im 61/121  bone]
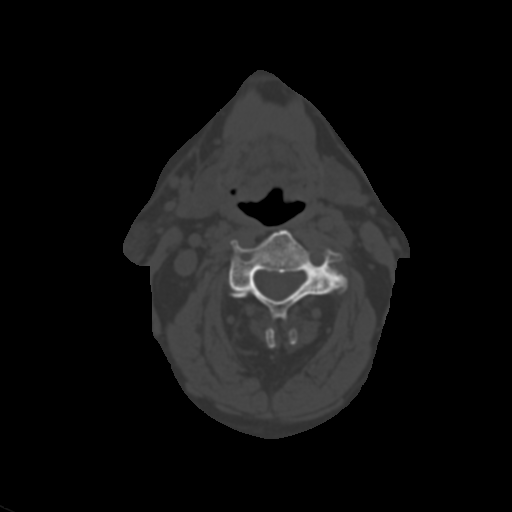
[im 91/121  bone]
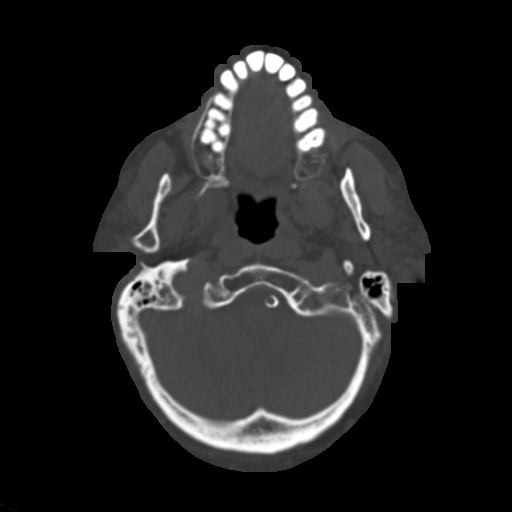

[Series 5: cor pre · coronal · non-contrast · 0.35mm/px · 3 of 82 slices shown]
[im 17/82  bone]
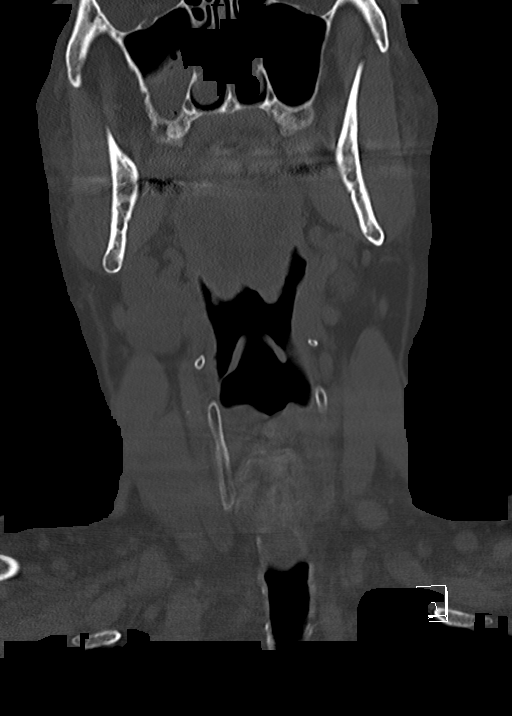
[im 33/82  bone]
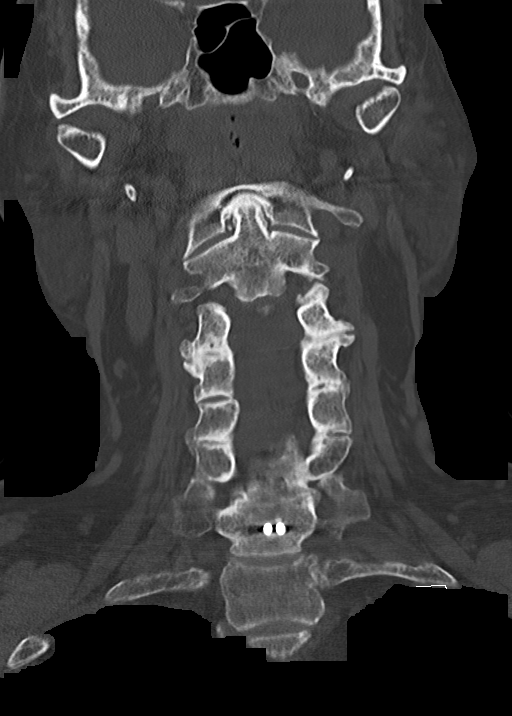
[im 49/82  bone]
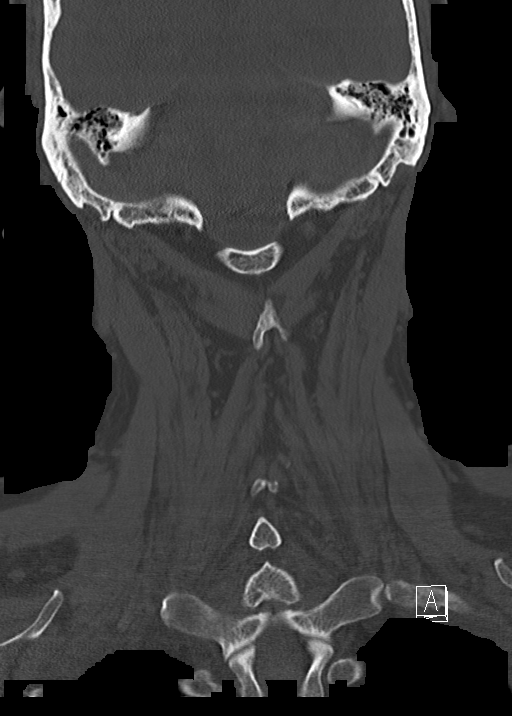

[Series 6: sag pre · sagittal · non-contrast · 0.35mm/px · 5 of 61 slices shown, 6 images]
[im 21/61  bone]
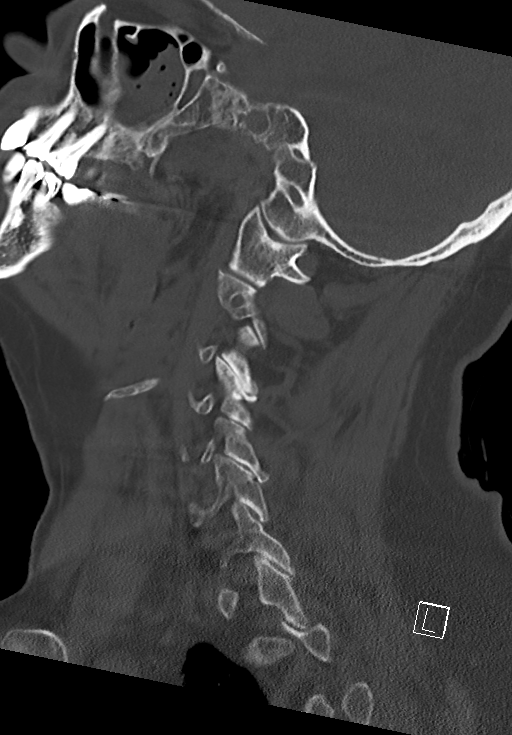
[im 26/61  bone]
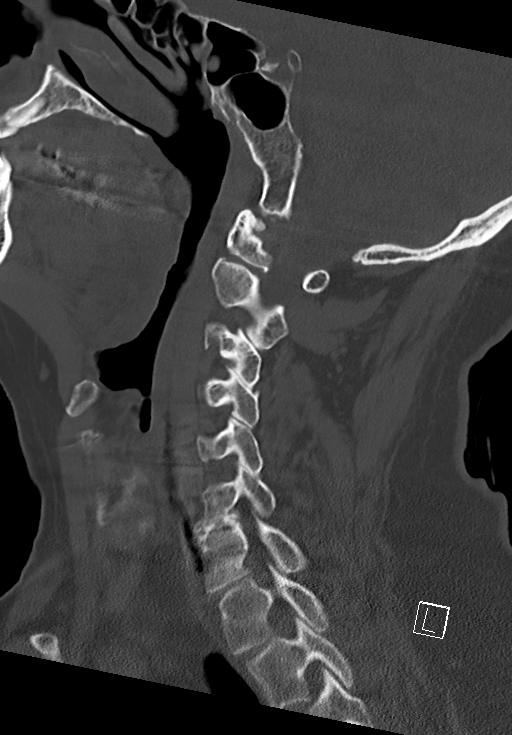
[im 31/61  soft-tissue]
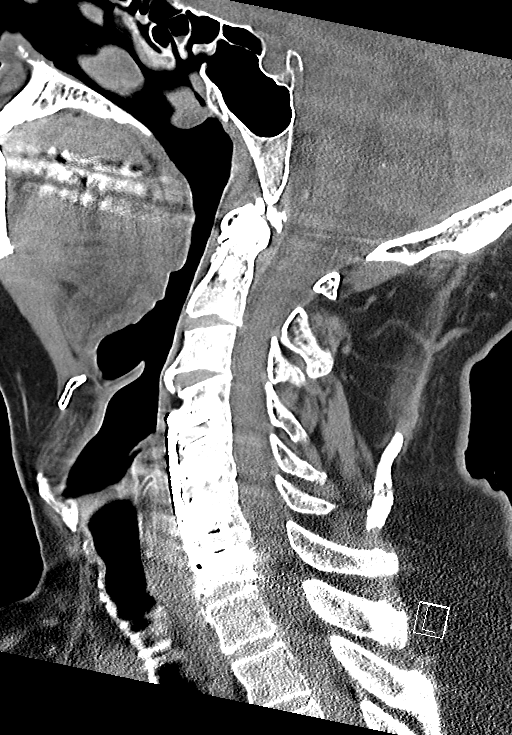
[im 31/61  bone]
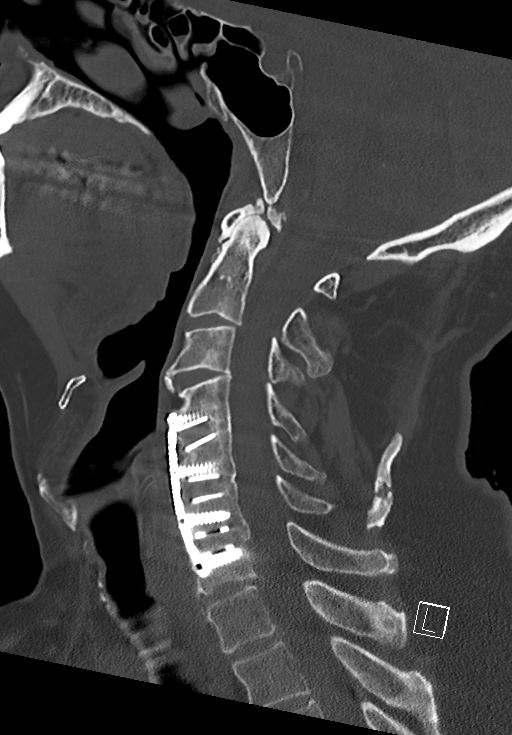
[im 36/61  bone]
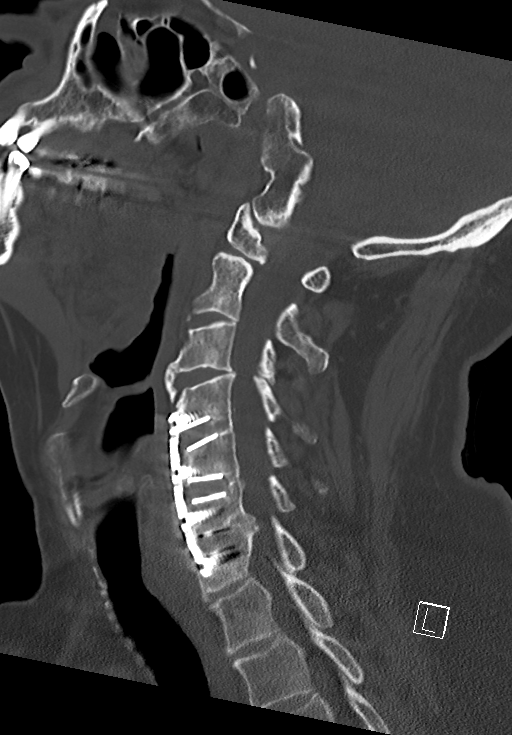
[im 41/61  bone]
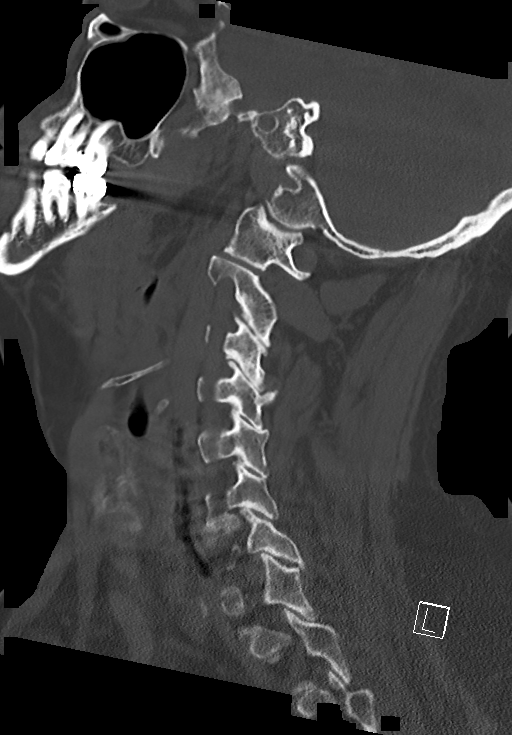

[11 of 33 positions shown; findings below may reference images not displayed]

IMPRESSION: 1. Postoperative changes cervical spine with no evidence of screw fracture or hardware    
 failure.                                                                                  
 2. Degenerative changes with central canal stenosis and neural foraminal narrowing as     
 detailed above.

## 2021-02-17 IMAGING — CR DX Chest X-Ray Portable
1 series · 1 of 1 positions shown · non-contrast
Comparison: none

EXAM:  Portable AP film of the chest performed February 18, 2021.
CLINICAL HISTORY: Chest pain.

[chest ap vg]
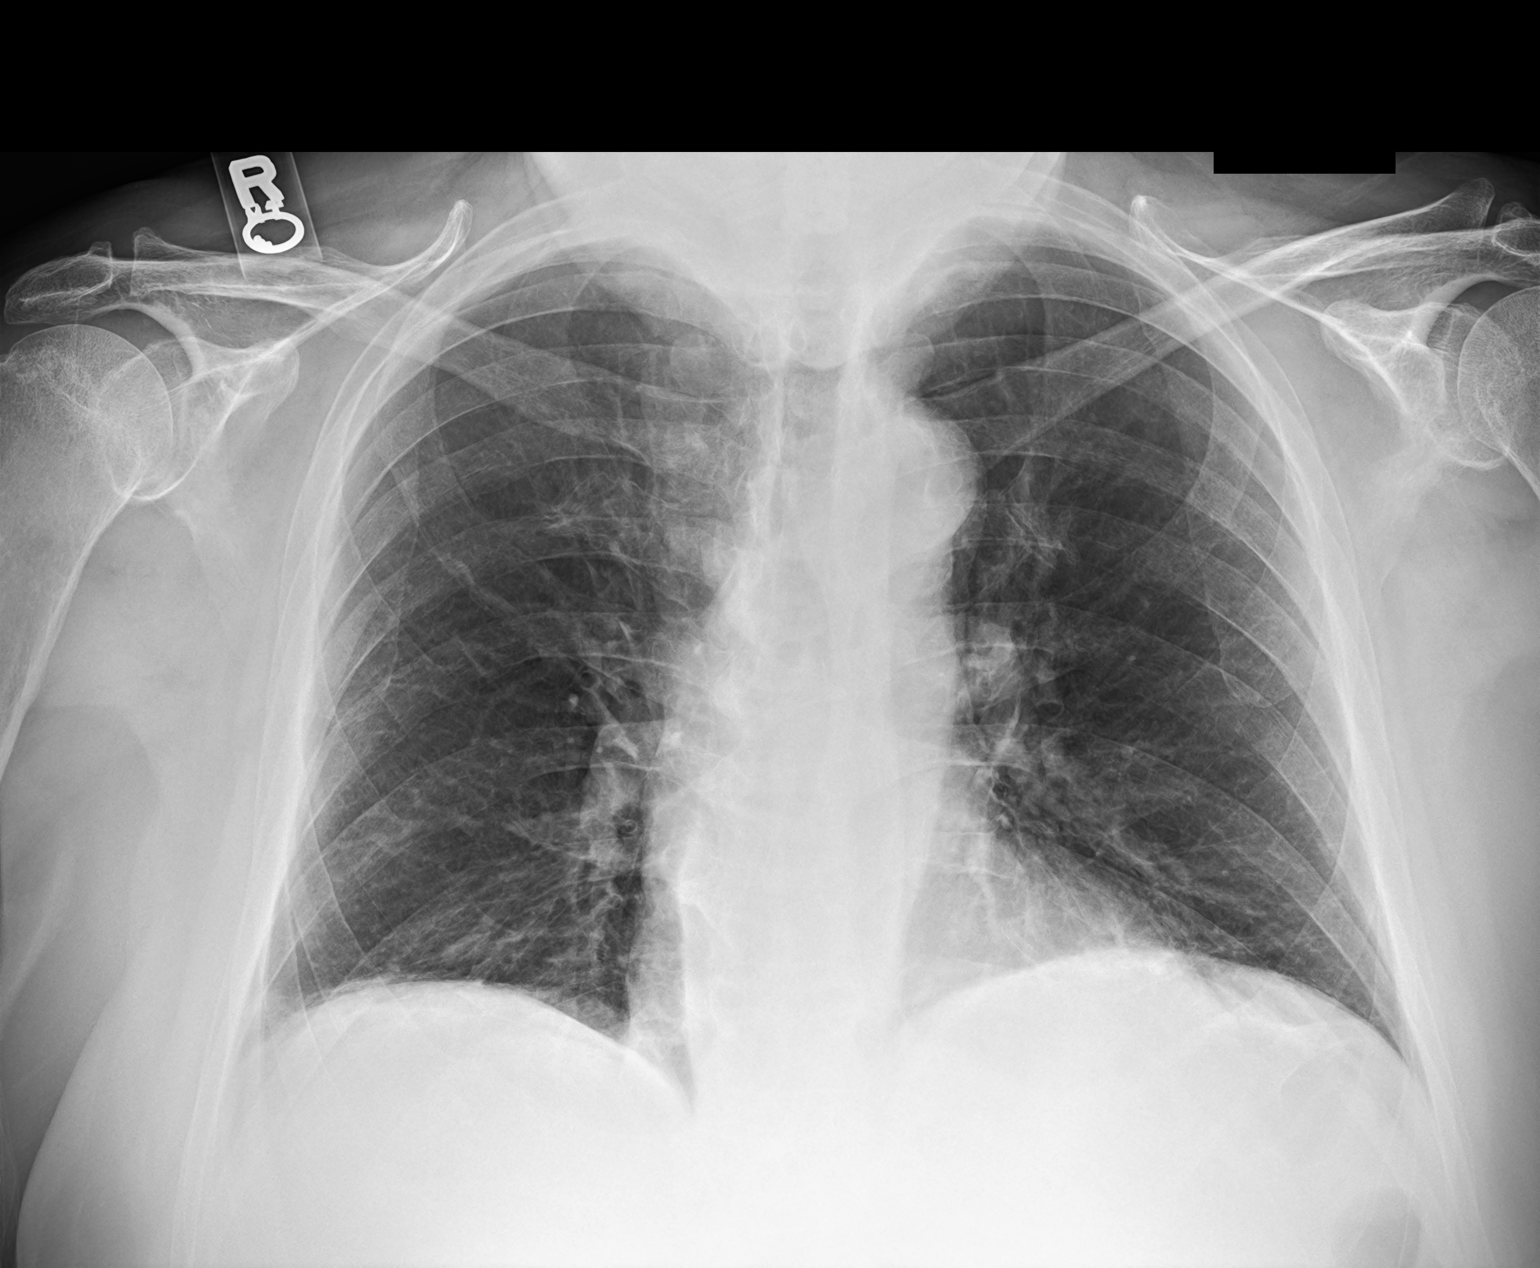

[1 of 1 positions shown; findings below may reference images not displayed]

FINDINGS: The lungs are slightly hypoexpanded. Minimal bibasilar opacities consistent     
 with atelectasis. The pulmonary parenchyma is otherwise clear.  There is no pleural fluid 
 or pneumothorax evident.  The cardiomediastinal silhouette and pulmonary vascularity are  
 within normal limits. There are degenerative changes of the spine. Lower cervical fusion  
 hardware is redemonstrated.
IMPRESSION: Hypoexpansion with mild bibasilar atelectasis.

## 2021-02-18 IMAGING — MR MR [PERSON_NAME] Contrast
7 of 9 series · 19 of 48 positions shown · non-contrast
Comparison: MR angiogram head without contrast April 02, 2016 from [HOSPITAL] clinic. CT

EXAMINATION:  MR angiogram head without contrast
HISTORY: Intracerebral Stenosis
TECHNIQUE: Axial 3-D time-of-flight noncontrast MR angiogram of head was performed.       
 Multiple maximum intensity projection reformat sequences acquired.

[Series 4: tof_3d_multi-slab · axial · 0.5mm · 0.35mm/px · z∈[-81,+11]mm · 13 of 194 slices shown]
[im 1/194]
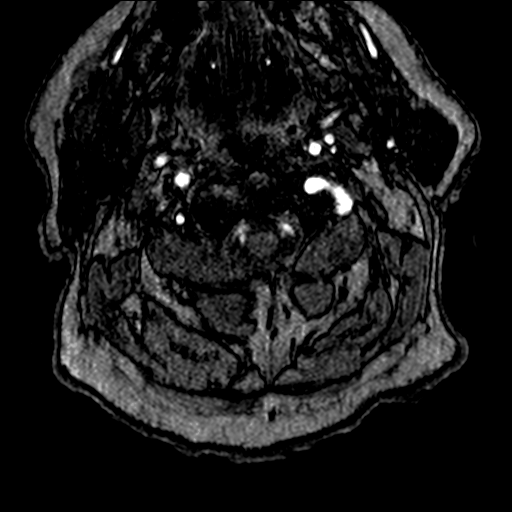
[im 5/194]
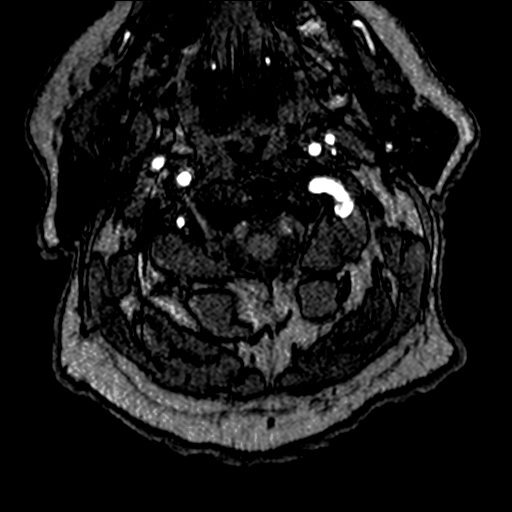
[im 10/194]
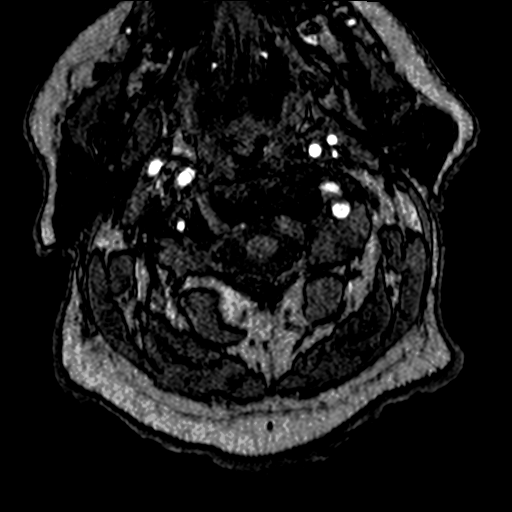
[im 30/194]
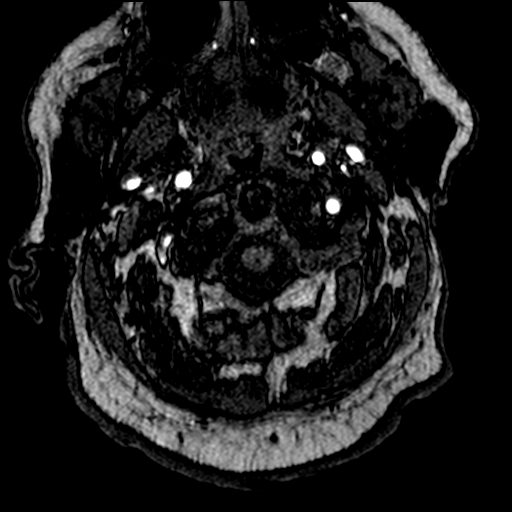
[im 35/194]
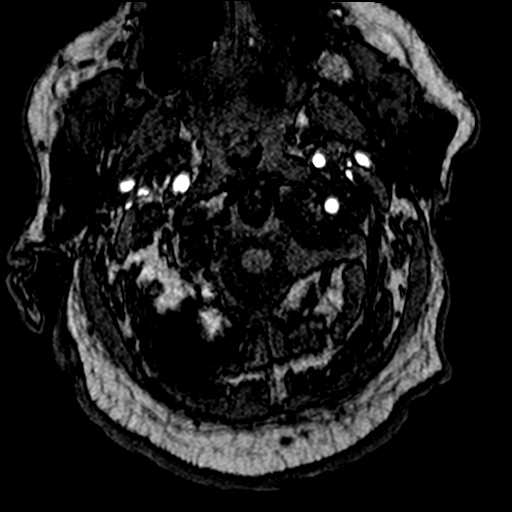
[im 60/194]
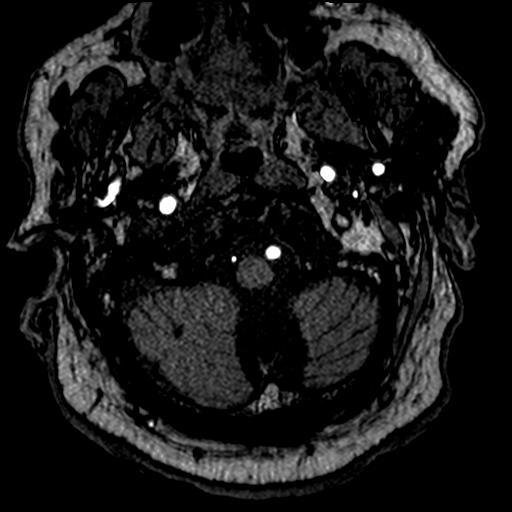
[im 85/194]
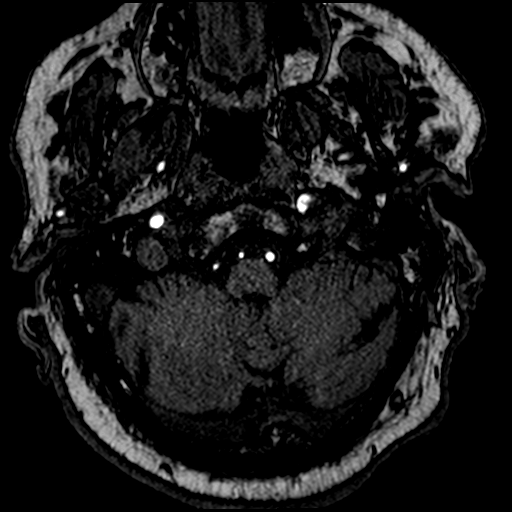
[im 99/194]
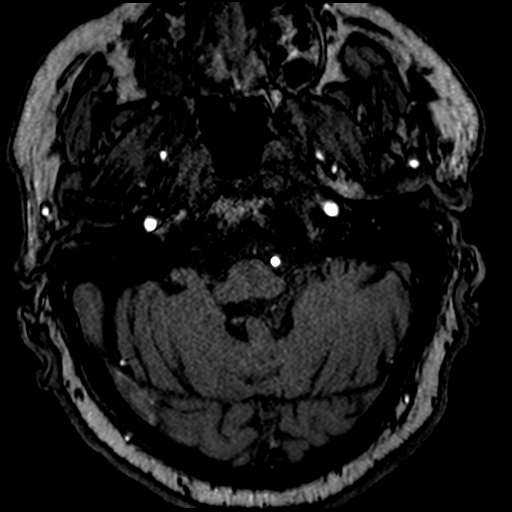
[im 109/194]
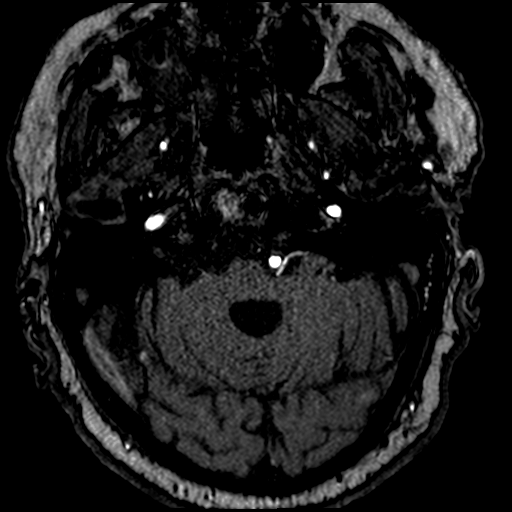
[im 134/194]
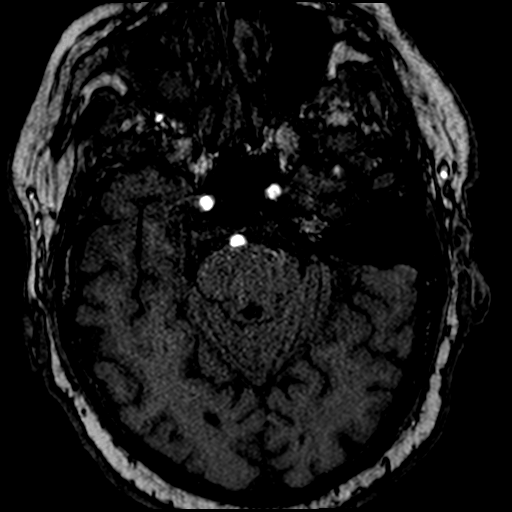
[im 159/194]
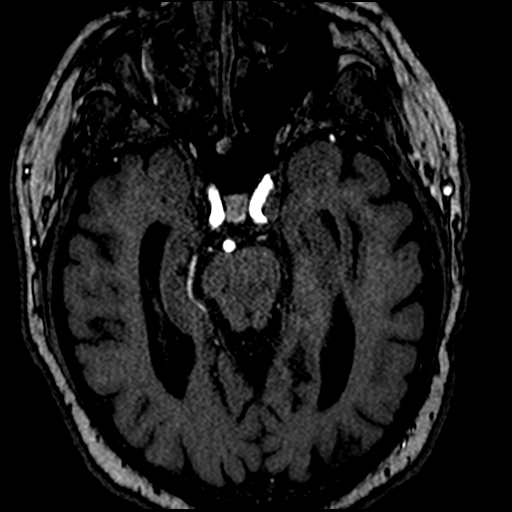
[im 164/194]
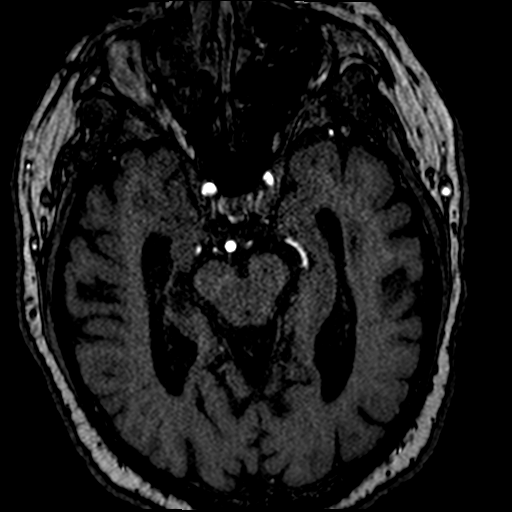
[im 184/194]
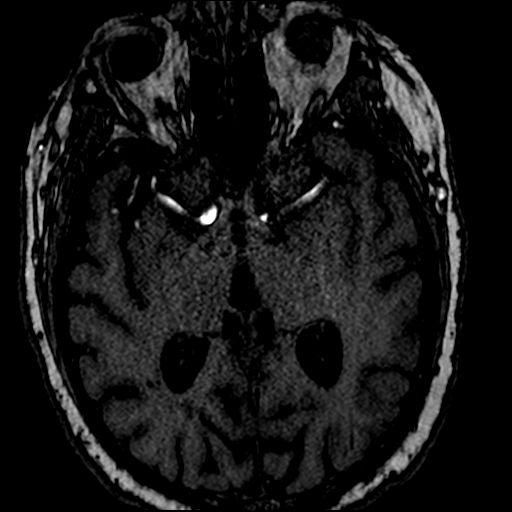

[Series 7: tof_3d_multi-slab_mip_(person_name) · axial · 97.0mm · 0.35mm/px · 1 of 1 slices shown]
[im 1/1]
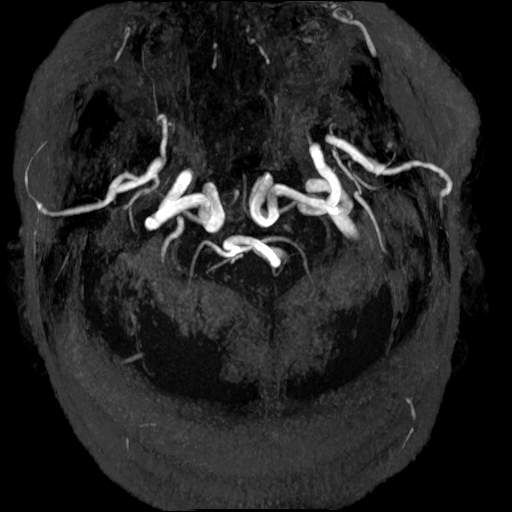

[Series 1015: lt l-r · axial · 0.89mm/px · 1 of 1 slices shown]
[im 1/1]
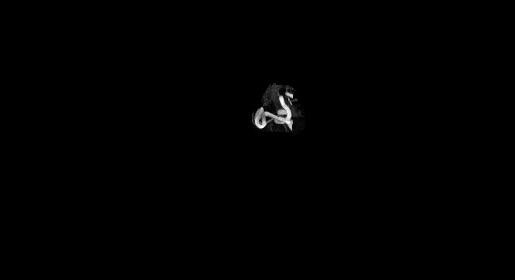

[Series 1031: rt l-r · axial · 0.89mm/px · 1 of 2 slices shown]
[im 1/2]
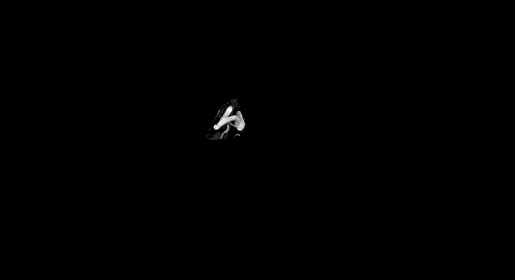

[Series 1038: rt i-s · axial · 0.35mm/px · 1 of 5 slices shown]
[im 1/5]
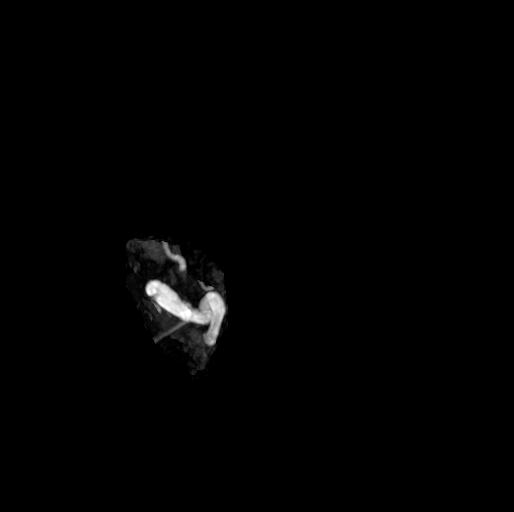

[Series 1045: post l-r · axial · 0.89mm/px · 1 of 1 slices shown]
[im 1/1]
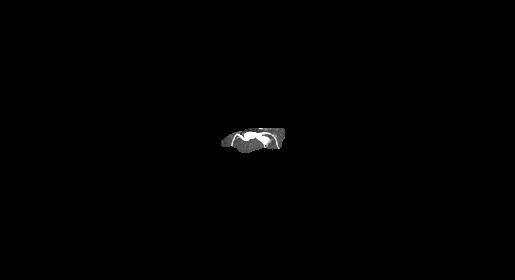

[Series 1052: post i-s · axial · 0.35mm/px · 1 of 3 slices shown]
[im 1/3]
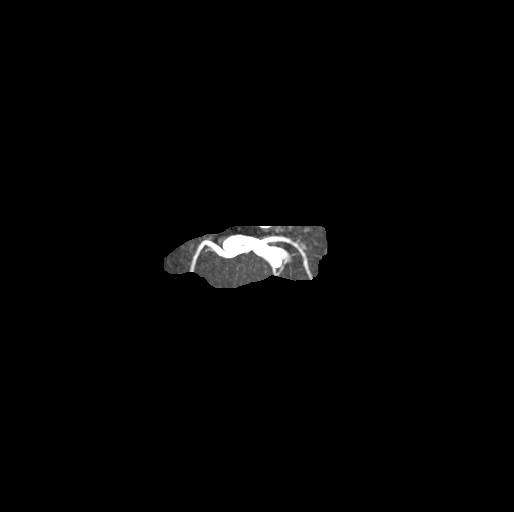

[19 of 48 positions shown; findings below may reference images not displayed]

FINDINGS: Anterior circulation:                                                                     
 Flow related signal is noted within the middle cerebral, distal intracranial internal     
 carotid arteries and the A1 segments of the anterior cerebral arteries. The remainder of  
 the anterior cerebral arteries were not included in the field-of-view of this study       
 limiting evaluation of the anterior cerebral arteries overall.                            
 There is narrowing of the A1 segment the right anterior cerebral artery likely            
 corresponding to the questioned stenosis on the CT study February 18, 2021. This is new    
 compared to the MR angiogram head study April 02, 2016. CTA would be better suited    
 for more definitive evaluation of this finding.                                           
 No large vessel occlusion, hemodynamically significant, flow-limiting stenosis or focal   
 aneurysm is identified within the middle cerebral or distal intracranial internal carotid 
 arteries.                                                                                 
 Posterior circulation:                                                                    
 Flow related signal noted within the dominant left vertebral artery and a diminutive      
 right vertebral artery which is similar in configuration compared to previous MRA study.  
 Right vertebral artery terminates in right PICA. Flow related signal noted in the basilar 
 artery and the P1 segments posterior cerebral arteries. No hemodynamically significant,   
 flow-limiting stenosis, large vessel occlusion or focal aneurysm within the vertebral     
 arteries or basilar artery.
IMPRESSION: 1. New A1 segment stenosis right anterior cerebral artery compared to MR angiogram April 02, 2016 and corresponding to the CT finding February 18, 2021. Suggest further evaluation 
 with nonemergent CTA head for more precise evaluation and as the anterior cerebral        
 arteries were not completely included in the field-of-view of this study.                 
 2. Dominant left vertebral artery flow-void stable compared to previous MR study. No      
 large vessel occlusion, hemodynamically significant, flow-limiting stenosis or focal      
 aneurysm in the remainder of the anterior or posterior intracranial arterial circulation  
 within study limitations.

## 2021-03-23 IMAGING — CT CT Brain W-O Contrast AIS
3 of 5 series · 14 of 47 positions shown, 16 images · non-contrast
Comparison: None

CT Brain W-O Contrast AIS
INDICATION: Suspected CVA. STROKE PROTOCOL: PHONE RESULTS TO ORDERING                    
 PHYSICIAN.                                                                                
 Pertinent History: CODE STROKE, LKW 1122, Right facial droop, Bilateral weakness,         
 Diabetic, Hypertension, CKD stage III                                                     
 Surgical History:                                                                         
 Cancer: Skin                                                                              
 Technologist Comments: None
TECHNIQUE: Helical acquisition with sagittal and coronal reformats.                       
 Utilized dose reduction techniques include: Automated Exposure Control, vendor specific   
 iterative reconstruction technique                                                        
 This Scan was analyzed using [employer] artificial intelligence software for LVO         
 detection

[Series 5: ax pre- autosend to viz · axial · non-contrast · 0.45mm/px · z∈[+92,+226]mm · 8 of 81 slices shown, 10 images]
[im 7/81  brain]
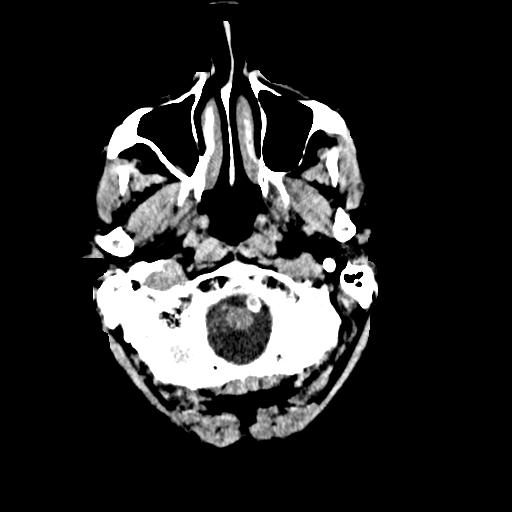
[im 7/81  bone]
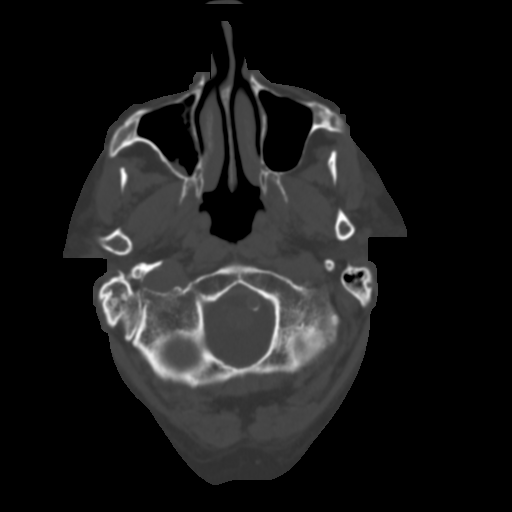
[im 21/81  brain]
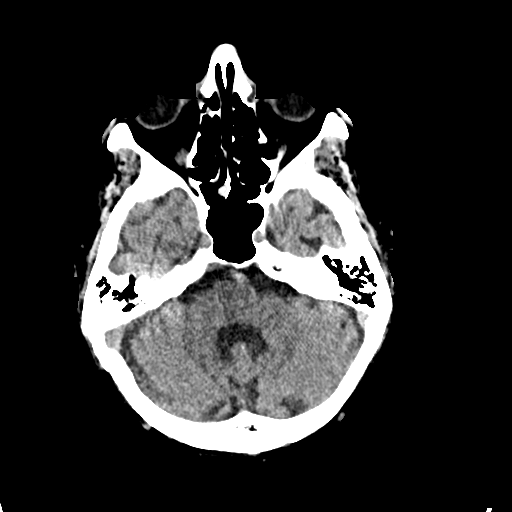
[im 27/81  brain]
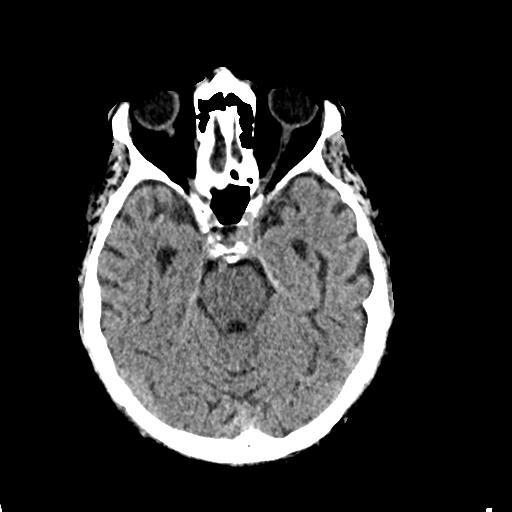
[im 34/81  brain]
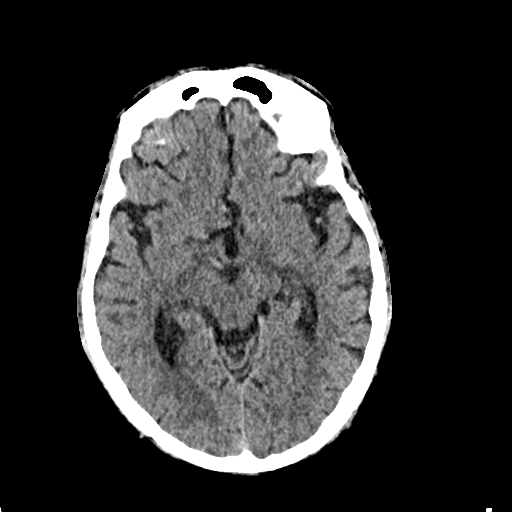
[im 47/81  brain]
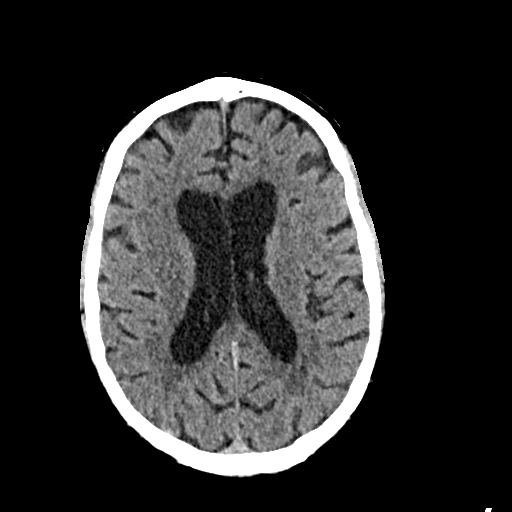
[im 47/81  bone]
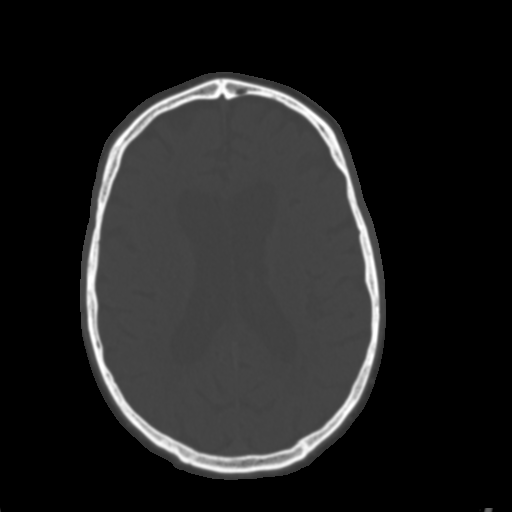
[im 54/81  brain]
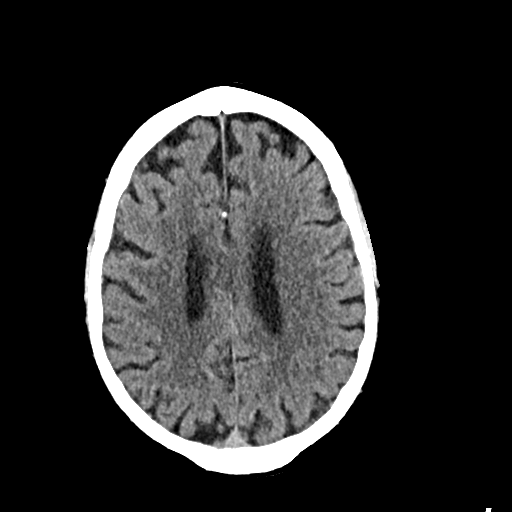
[im 61/81  brain]
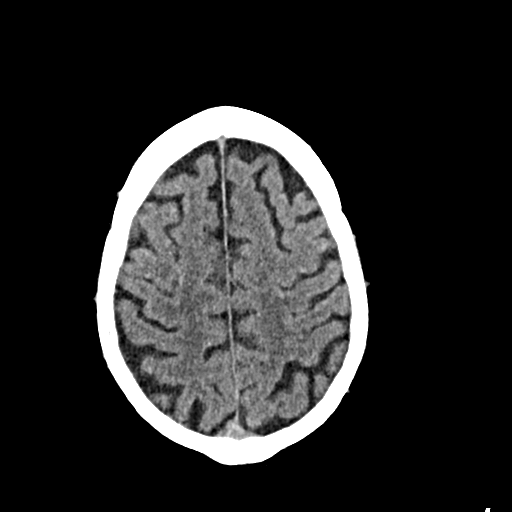
[im 74/81  brain]
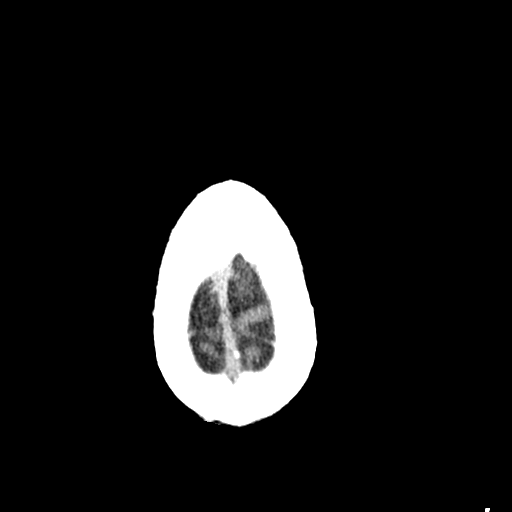

[Series 6: cor pre · coronal · non-contrast · 0.31mm/px · 3 of 63 slices shown]
[im 21/63  brain]
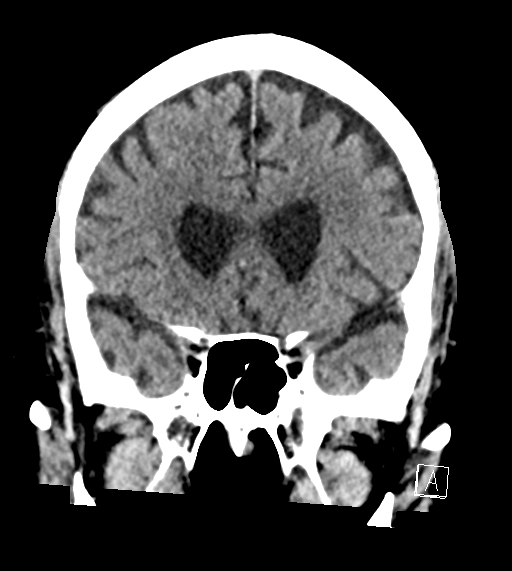
[im 28/63  brain]
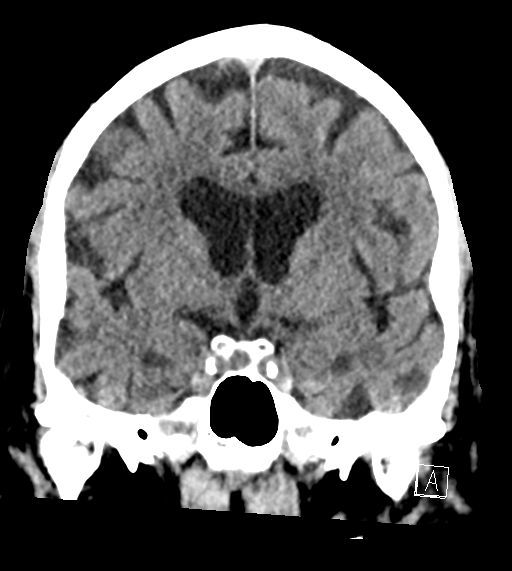
[im 35/63  brain]
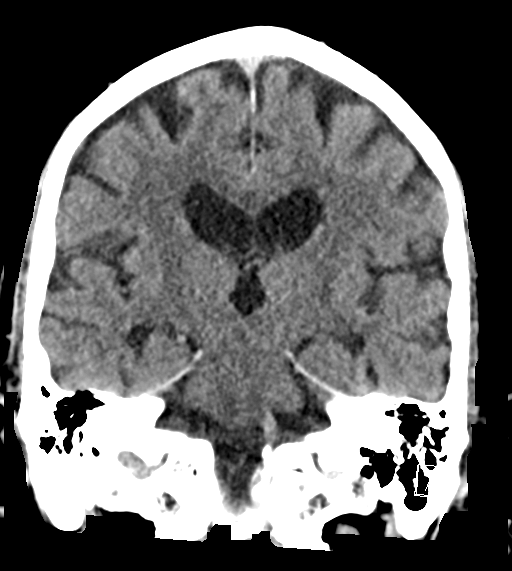

[Series 7: sag pre · sagittal · non-contrast · 0.34mm/px · 3 of 53 slices shown]
[im 18/53  brain]
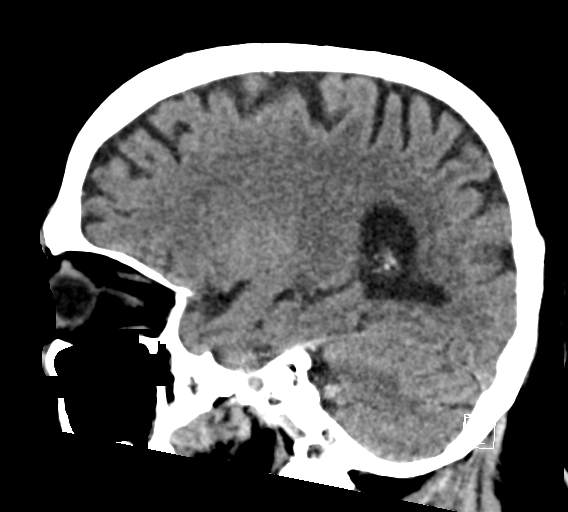
[im 27/53  brain]
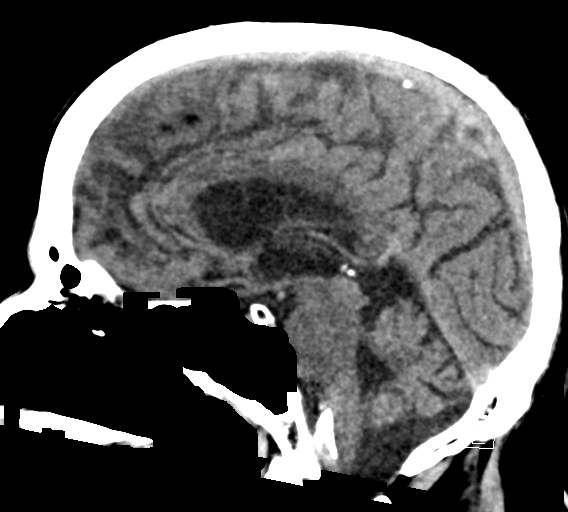
[im 35/53  brain]
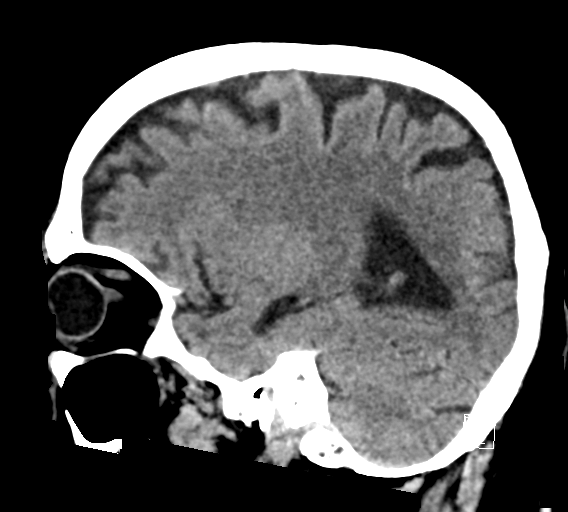

[14 of 47 positions shown; findings below may reference images not displayed]

FINDINGS: ASPECTS: Please note is difficult to accurately score patients with extensive white       
 matter disease. Regions of chronic ischemic infarction are also not included in the       
 score. Grading performed of bilateral MCA regions. Areas assessed include the caudate,    
 internal capsule, lentiform nucleus, insular ribbon, M1, M2, M3, M4, M5, and M6. A single 
 point is subtracted from 10 for each area of involvement.                                 
 Abnormal Regions: None.                                                                   
 Total ASPECT score:  10, normal.                                                          
 HEMORRHAGE: No acute intracranial hemorrhage.                                             
 BRAIN PARENCHYMA, ADDITIONAL: Microvascular changes. Small remote right cerebellar        
 lacunar infarct.                                                                          
 BASAL CISTERNS AND EXTRA AXIAL SPACES:  Enlarged.                                         
 VENTRICULAR SYSTEM: Enlarged due to ex vacuo dilatation.                                  
 MIDLINE SHIFT: None.                                                                      
 CALVARIUM: Normal.                                                                        
 VISUALIZED PARANASAL SINUSES: Chronic right maxillary sinusitis..                         
 VISUALIZED ORBITS: Bilateral pseudophakia.                                                
 OTHER: None.
IMPRESSION: 1.  No acute intracranial abnormality.                                                    
 2.  ASPECT score: 10, normal.                                                             
 3.  Mild atrophy and chronic microvascular white matter disease with remote lacunar       
 infarct of the right cerebellum.                                                          
 4.  Chronic right maxillary sinusitis.                                                    
 5.  Concur with preliminary impression by the [HOSPITAL].

## 2021-03-24 IMAGING — MR MR Brain W-O Contrast
4 of 10 series · 28 of 48 positions shown · IV contrast (agent unspecified)
Comparison: Noncontrast head CT from 03/23/2021 and MR brain from 02/18/2021

Exam: MR brain without contrast performed 03/24/2021
CLINICAL HISTORY: Difficulty with speech prior to admission concern for CVA               
 Contrast: None
TECHNIQUE: Sagittal T1, axial T2, T2 FLAIR, coronal T2 FLAIR, axial gradient echo imaging 
 and diffusion-weighted imaging.

[Series 5: DWI · axial · 5.0mm · 0.60mm/px · z∈[-70,+84]mm · 11 of 54 slices shown (1 of 2)]
[im 1/54]
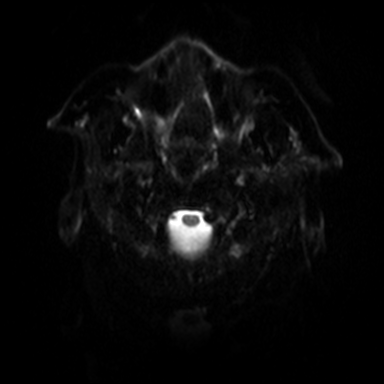
[im 6/54]
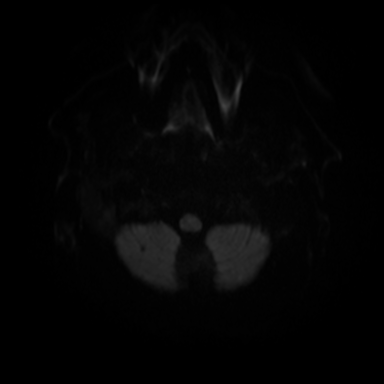
[im 11/54]
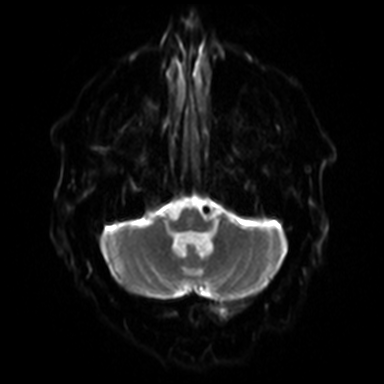
[im 16/54]
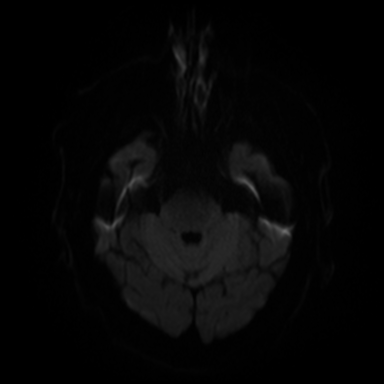
[im 22/54]
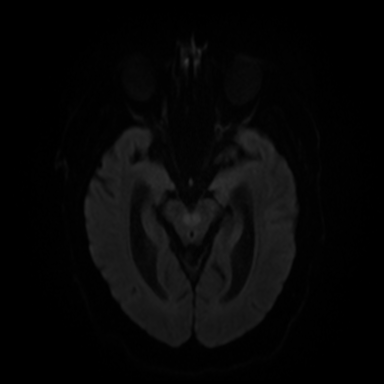
[im 27/54]
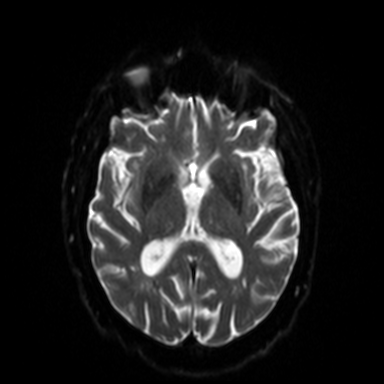
[im 32/54]
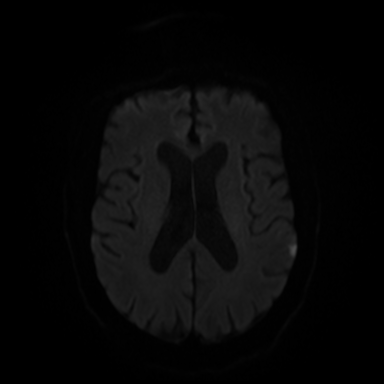
[im 38/54]
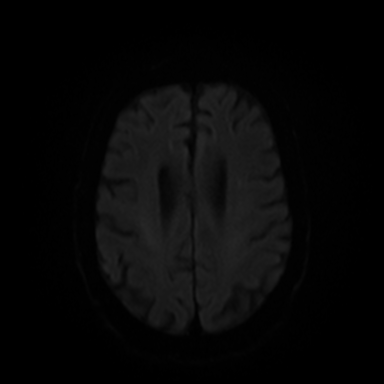
[im 43/54]
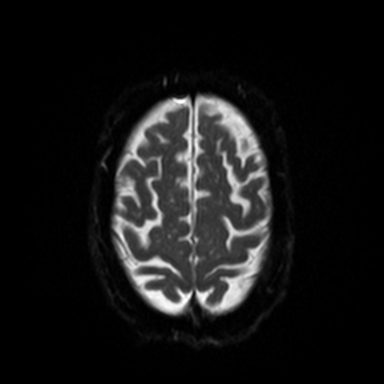
[im 48/54]
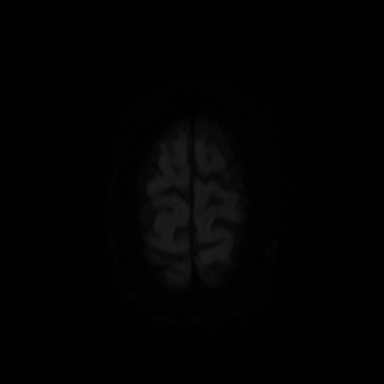
[im 54/54]
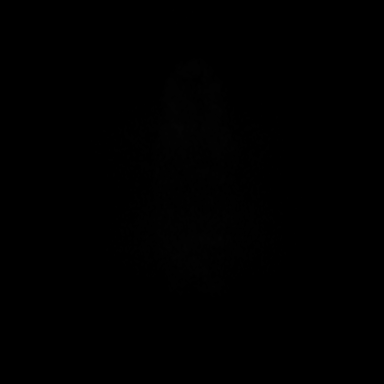

[Series 6: DWI · axial · 5.0mm · 0.60mm/px · z∈[-70,+84]mm · 5 of 27 slices shown (2 of 2)]
[im 1/27]
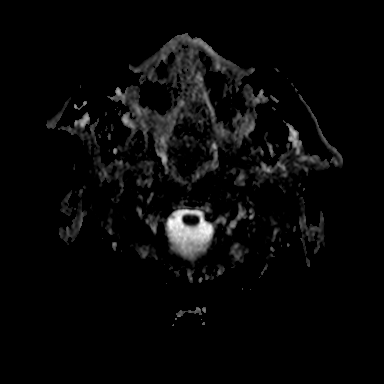
[im 7/27]
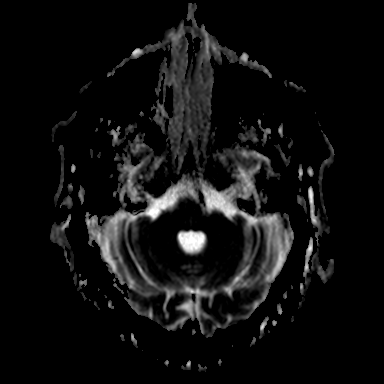
[im 14/27]
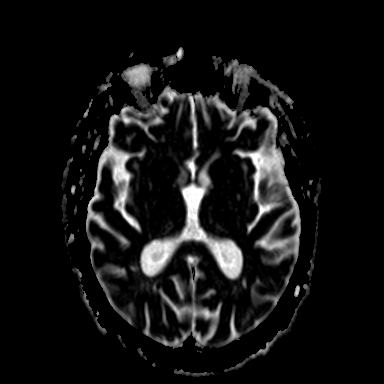
[im 20/27]
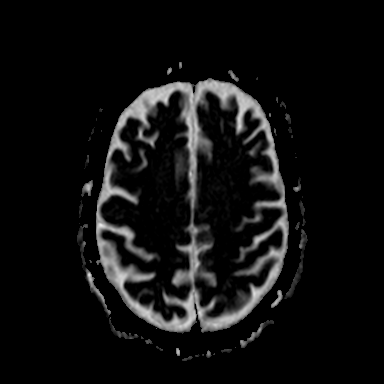
[im 27/27]
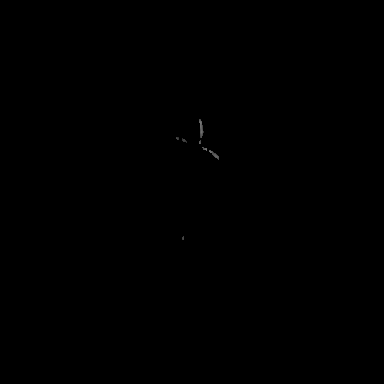

[Series 8: FLAIR · axial · 5.0mm · 0.45mm/px · z∈[-70,+85]mm · 6 of 27 slices shown (1 of 2)]
[im 1/27]
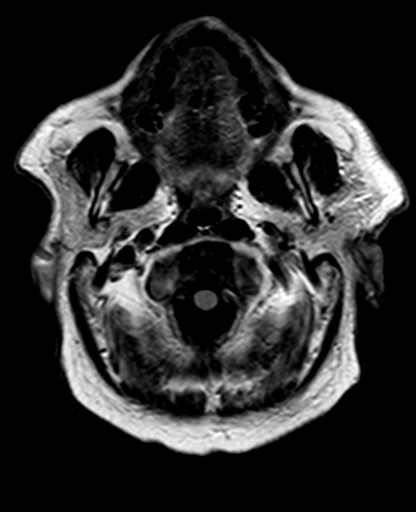
[im 6/27]
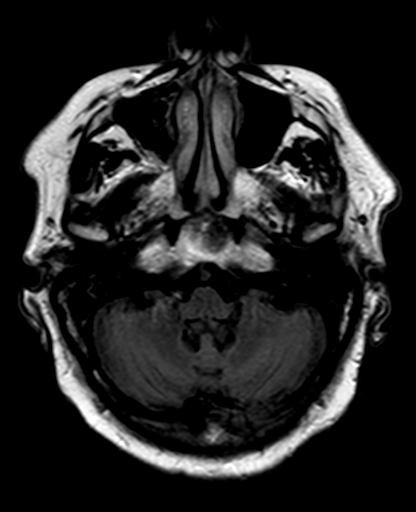
[im 11/27]
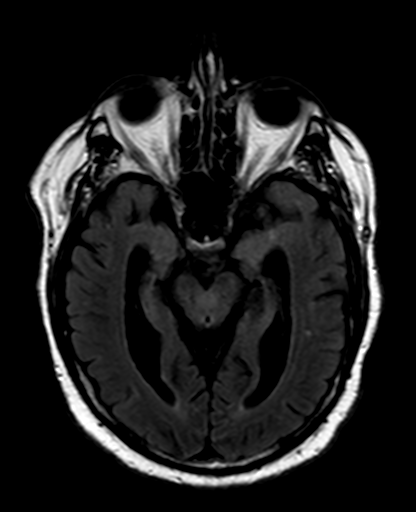
[im 16/27]
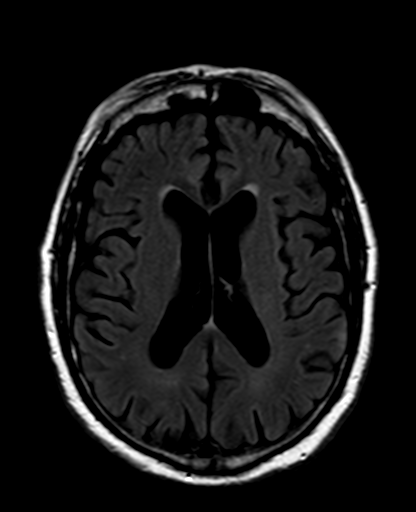
[im 21/27]
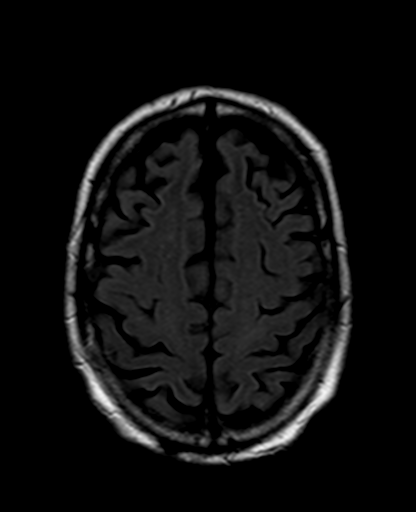
[im 27/27]
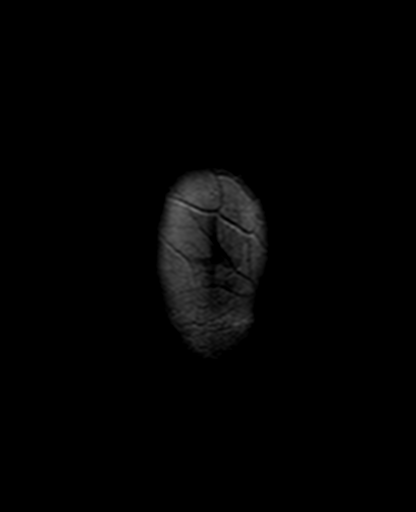

[Series 11: FLAIR · coronal · 5.0mm · 0.45mm/px · 6 of 28 slices shown (2 of 2)]
[im 1/28]
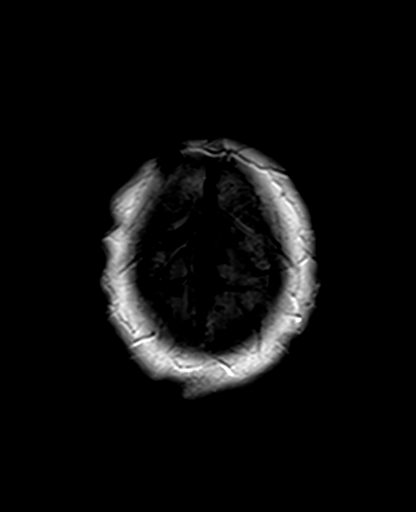
[im 6/28]
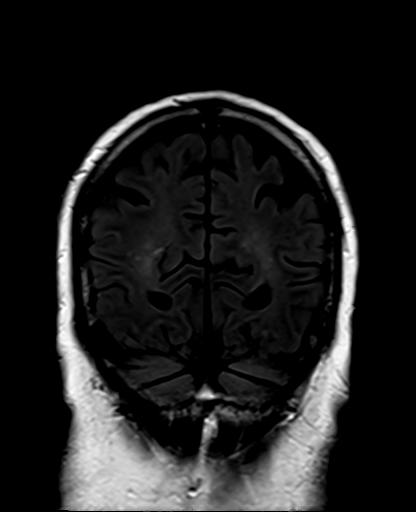
[im 11/28]
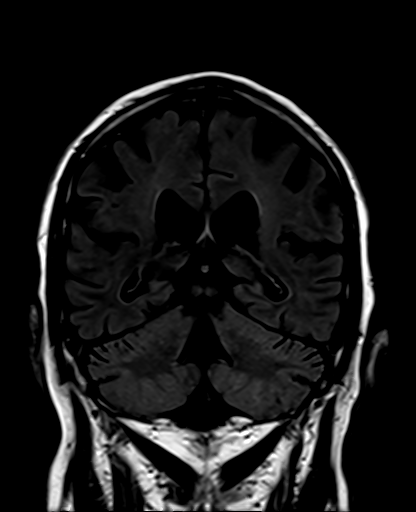
[im 17/28]
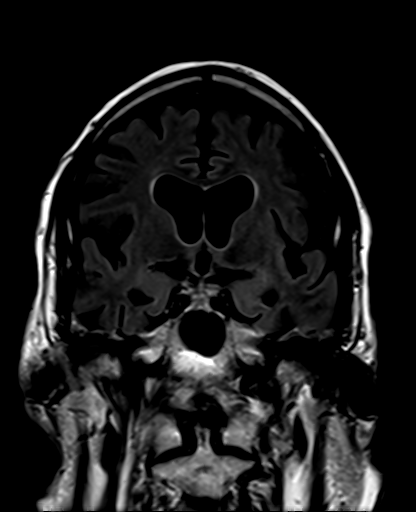
[im 22/28]
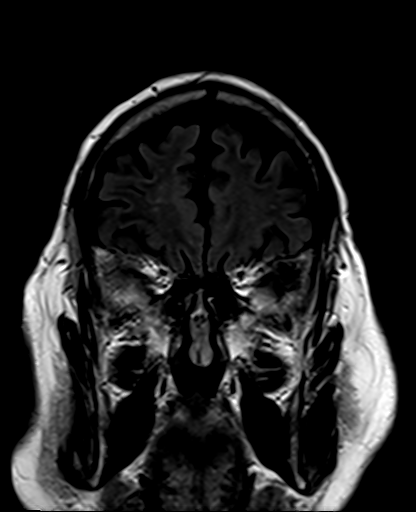
[im 28/28]
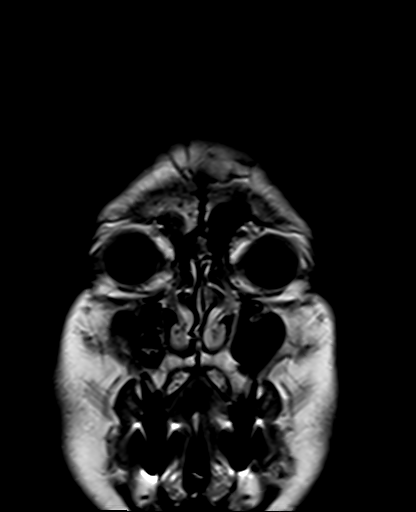

[28 of 48 positions shown; findings below may reference images not displayed]

FINDINGS: There are small foci of abnormal restricted diffusion within the posterior lateral left   
 frontal lobe cortex and a second focus in the lateral left parietal lobe cortex           
 consistent with small foci of acute infarct. There is no mass effect or midline shift.    
 Symmetric prominence of the ventricles and sulci is stable consistent with atrophy.       
 Punctate scattered foci of T2 hyperintensity within the white matter appear stable,       
 nonspecific but consistent with gliosis due to chronic small vessel ischemic disease.     
 Tiny old infarct in the inferior right cerebellum is stable. A normal flow void for the   
 basilar and cavernous portion of the internal carotid artery bilaterally is noted. The    
 midline structures and craniocervical junction have a normal MR appearance. There is mild 
 mucosal thickening in the posterior inferior right maxillary sinus. The paranasal sinuses 
 and mastoid air cells are otherwise clear.
IMPRESSION: Small foci of abnormal restricted diffusion consistent with acute infarcts within the     
 cortex of the posterior lateral left frontal lobe and lateral left parietal lobe.         
 Stable evidence of atrophy and chronic small vessel ischemic disease with stable small    
 right cerebellar infarct.                                                                 
 Right maxillary sinus mucosal thickening.

## 2021-03-24 IMAGING — US US Carotid Doppler Complete Bilateral
1 series · 12 of 16 positions shown · non-contrast
Comparison: US dated:02/18/21

US Carotid Doppler Complete
INDICATION: CVA.                                                                         
 Pertinent History: None.
TECHNIQUE: Duplex sonographic evaluation of the bilateral carotid arteries was performed. 
 Measurement of carotid stenosis is based on velocity parameters that correlate the        
 residual internal carotid diameter with [employer] Symptomatic Carotid Endarterectomy 
 Trial (NASCET)-based stenosis levels.                                                     
 Technologist Comments: None.                                                              
 Limitations: None.

[Series 1: us carotid doppler complete bilateral · 12 of 46 slices shown]
[im 1/46]
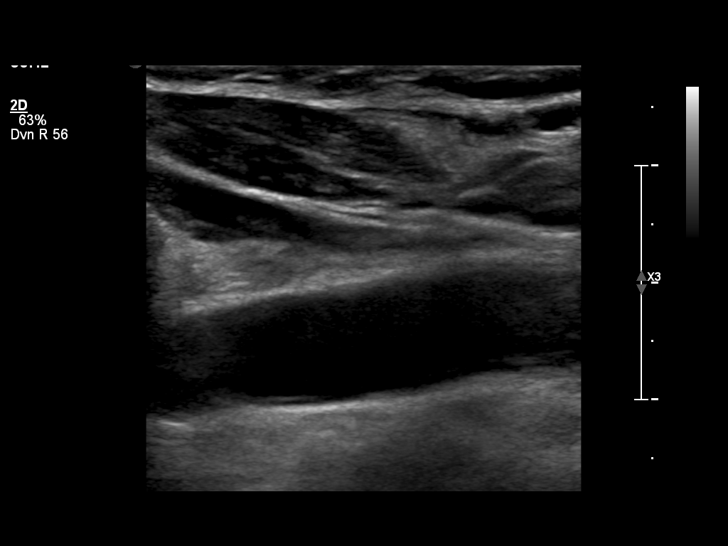
[im 7/46]
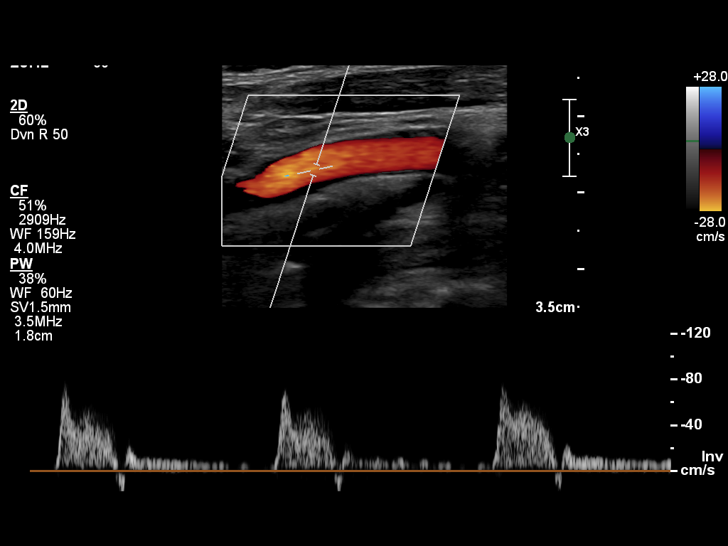
[im 10/46]
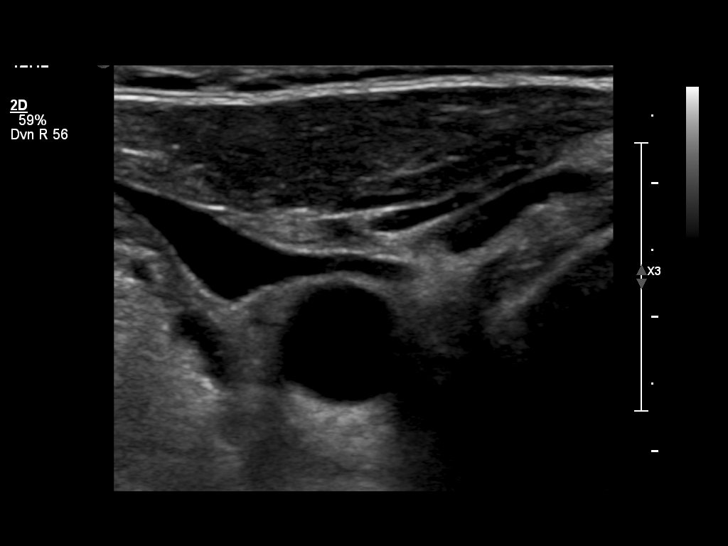
[im 13/46]
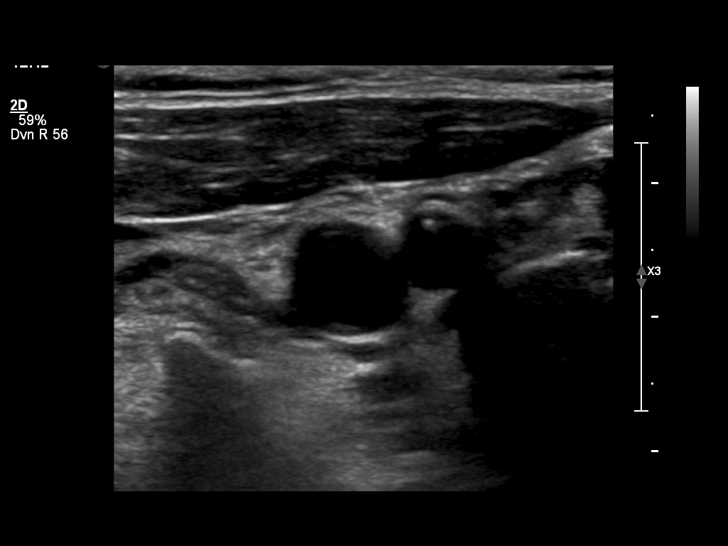
[im 19/46]
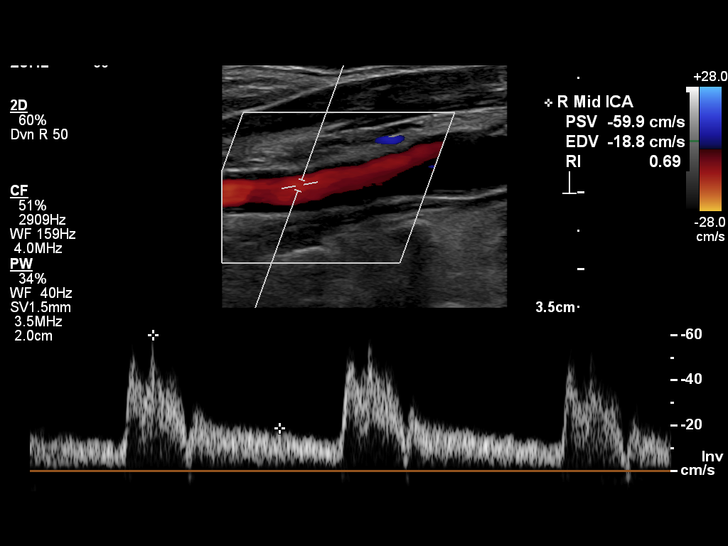
[im 22/46]
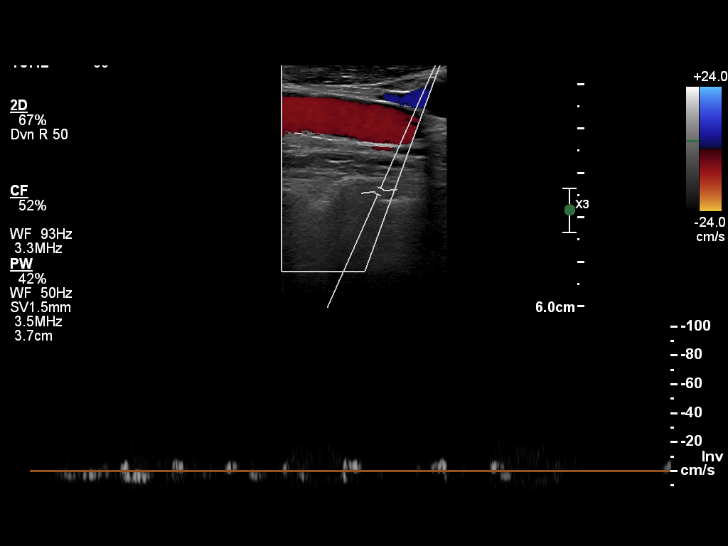
[im 25/46]
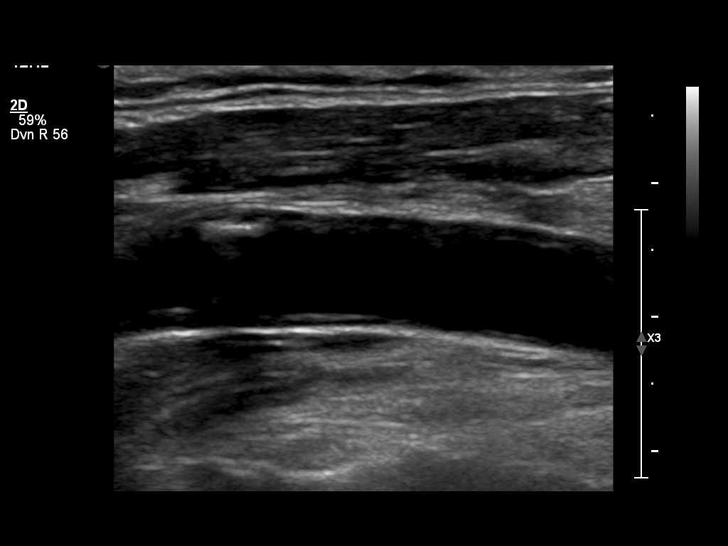
[im 31/46]
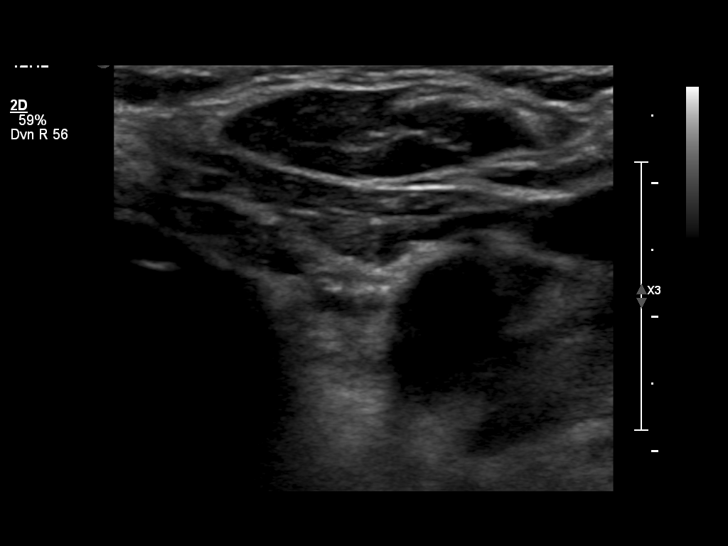
[im 34/46]
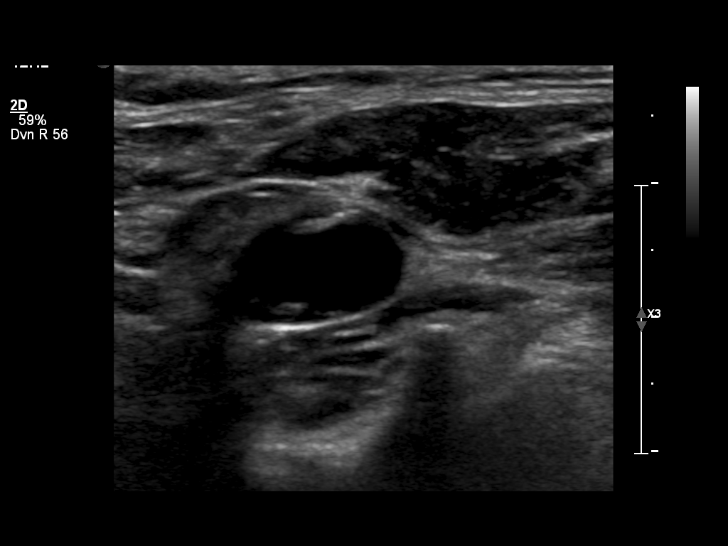
[im 37/46]
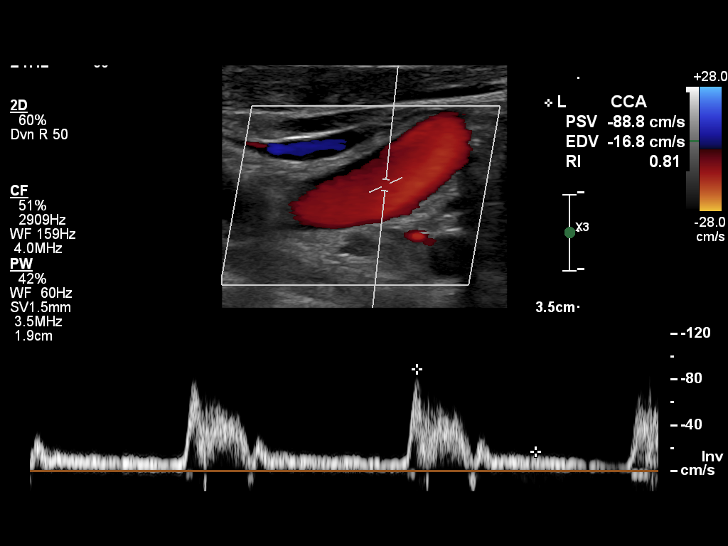
[im 43/46]
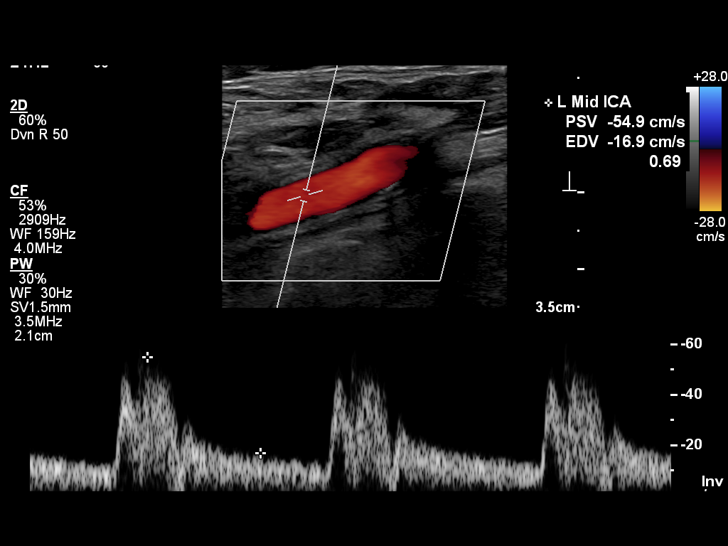
[im 46/46]
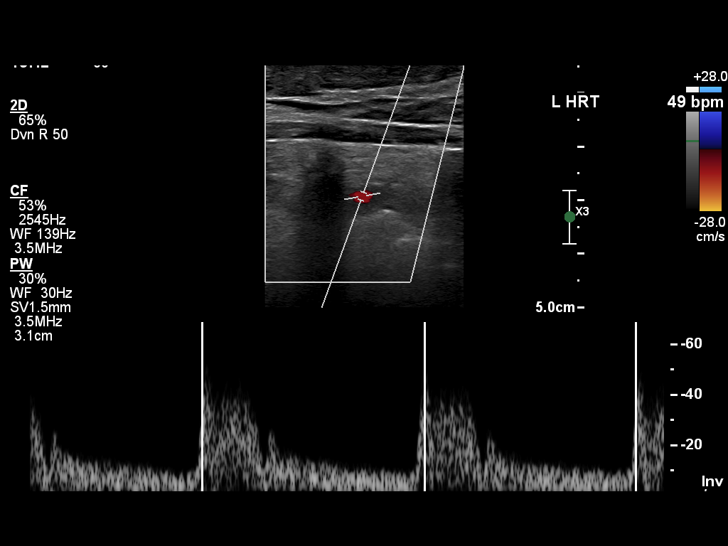

[12 of 16 positions shown; findings below may reference images not displayed]

FINDINGS: RIGHT CAROTID:                                                                            
 ICA PSV                                                                                   
 PROX (cm/s): 37 cm/s                                                                      
 MID (cm/s): 60 cm/s                                                                       
 DIST (cm/s): 75 cm/s                                                                      
 ICA EDV                                                                                   
 PROX (cm/s): 10 cm/s                                                                      
 MID (cm/s): 19 cm/s                                                                       
 DIST (cm/s): 21 cm/s                                                                      
 CCA DIST PSV (cm/s): 48 cm/s                                                              
 ICA/CCA RATIO:
 Waveforms: Normal.                                                                        
 Plaque: Mild. Plaque is mainly soft.                                                      
 RIGHT VERTEBRAL:                                                                          
 No flow demonstrated                                                                      
 LEFT CAROTID:                                                                             
 ICA PSV                                                                                   
 PROX (cm/s): 51 cm/s                                                                      
 MID (cm/s): 55 cm/s                                                                       
 DIST (cm/s): 52 cm/s                                                                      
 ICA EDV                                                                                   
 PROX (cm/s): 12 cm/s                                                                      
 MID (cm/s): 17 cm/s                                                                       
 DIST (cm/s): 16 cm/s                                                                      
 CCA DIST PSV (cm/s): 79 cm/s                                                              
 ICA/CCA RATIO:
 Waveforms: Normal.                                                                        
 Plaque: Mild. Plaque is mixed soft and calcified.                                         
 LEFT VERTEBRAL:                                                                           
 Antegrade. Normal waveform.                                                               
 Additional: None.                                                                         
 Degree of Stenosis Estimation                                                             
 Normal/<50%: ICA PSV <125, Plaque Estimate 0-50%, ICA/CCA <2.0, ICA EDV <40               
 50-69%: ICA PSV 125-230, Plaque Estimate >50%, ICA/CCA 2.0-4.0, ICA EDV 40-100            
 70%: ICA PSV >230, Plaque Estimate >50%, ICA/CCA >4.0, ICA EDV >100                       
 Near Occlusion: Variable values.                                                          
 Total Occlusion: Absent flow.                                                             
 0511.
IMPRESSION: 1.  No flow in the right vertebral artery demonstrated.                                   
 2.  No hemodynamically significant right carotid artery stenosis.                         
 3.  No hemodynamically significant left carotid artery stenosis.                          
 4.  Antegrade vertebral artery flow on the left.

## 2021-03-24 IMAGING — MR MR [PERSON_NAME] Contrast
5 series · 20 of 48 positions shown · non-contrast
Comparison: MRI March 24, 2021, MR [facility] February 18, 2021

TECHNIQUE: Multiplanar, multisequence time-of-flight MRA of the brain is performed        
 without administration of intravenous contrast. Maximum intensity projection reformatted  
 images were performed on an individual workstation.
INDICATION: CVA.

[Series 4: [id number]_multi-slab · axial · 0.5mm · 0.35mm/px · z∈[-112,-26]mm · 16 of 181 slices shown]
[im 1/181]
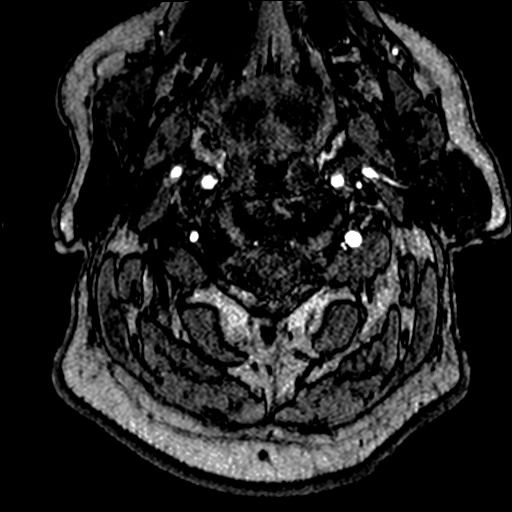
[im 5/181]
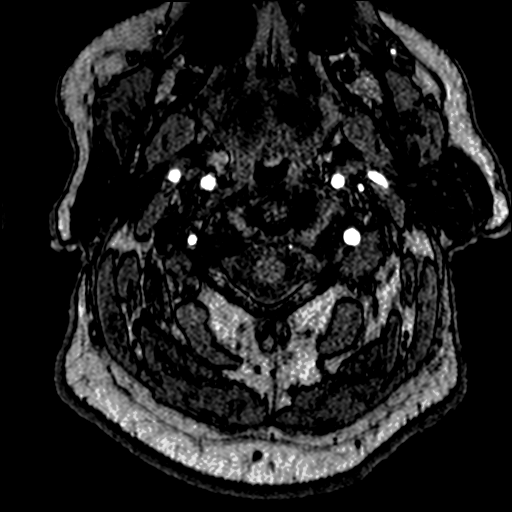
[im 9/181]
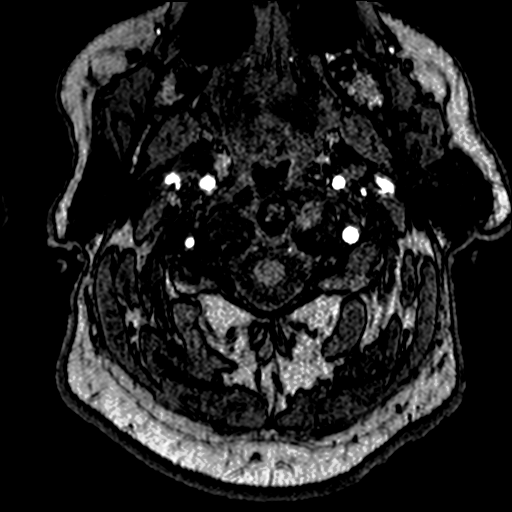
[im 13/181]
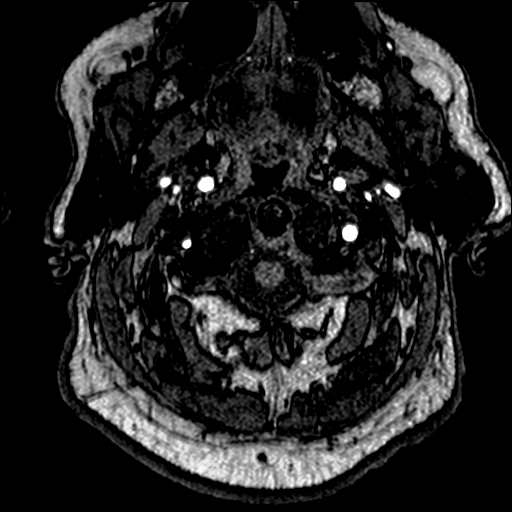
[im 17/181]
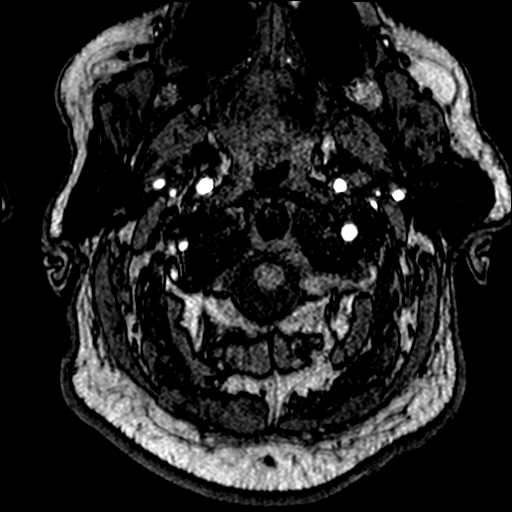
[im 21/181]
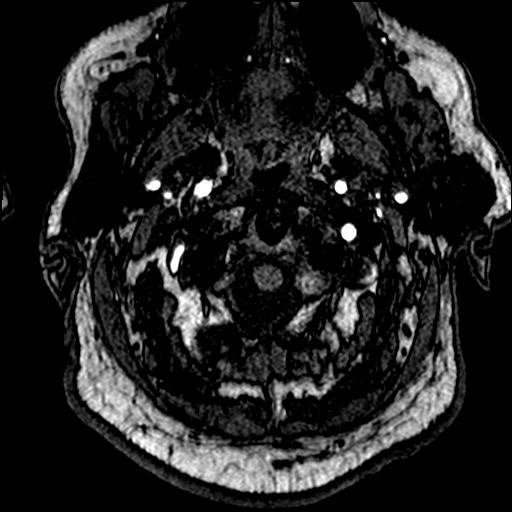
[im 30/181]
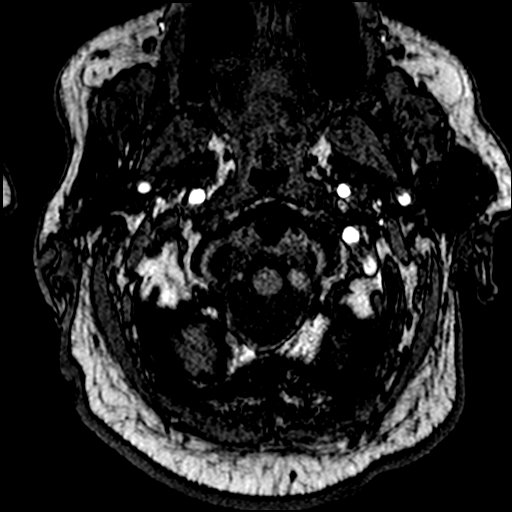
[im 34/181]
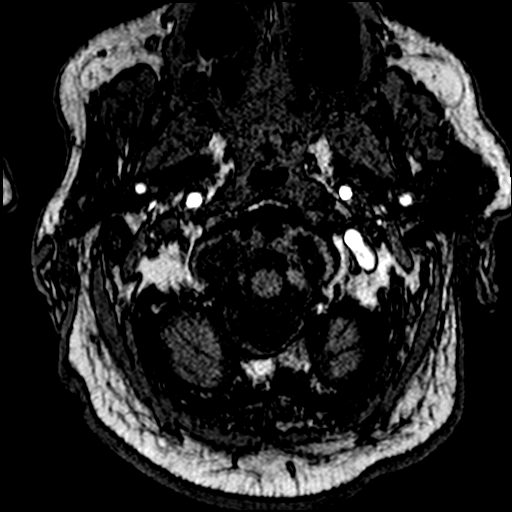
[im 55/181]
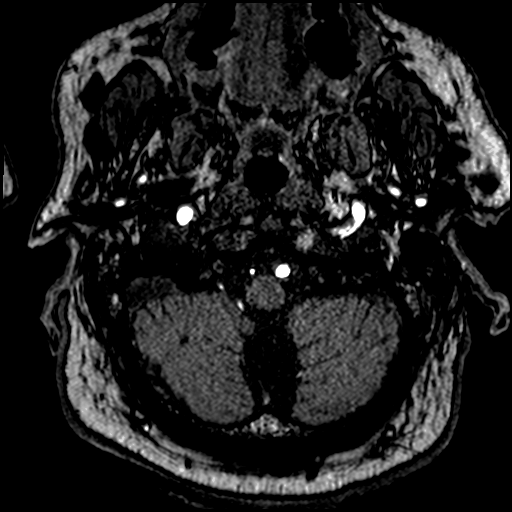
[im 80/181]
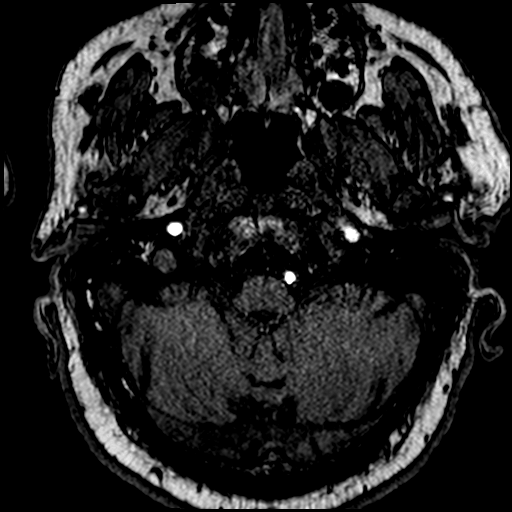
[im 93/181]
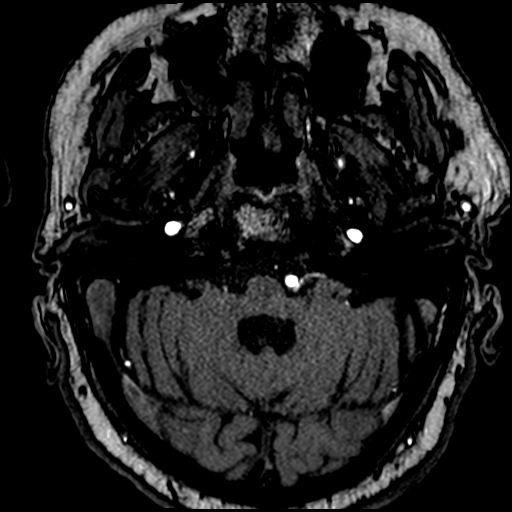
[im 101/181]
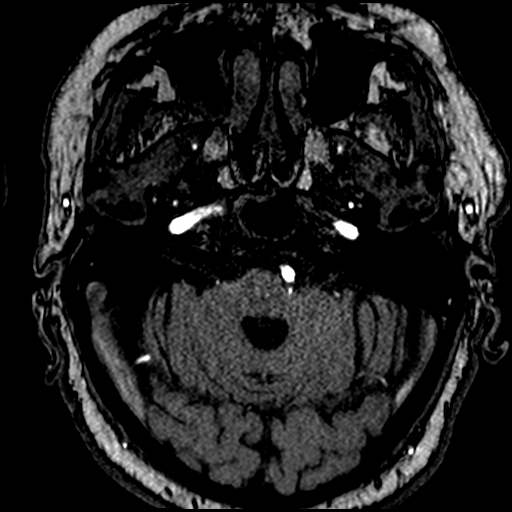
[im 126/181]
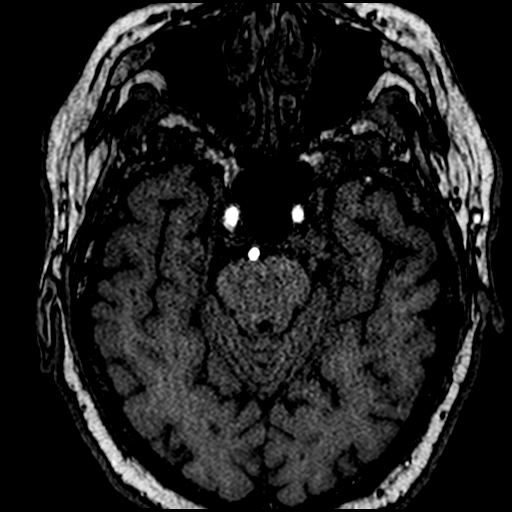
[im 147/181]
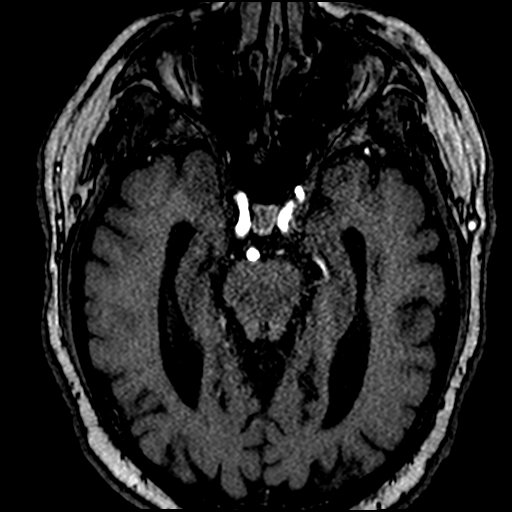
[im 151/181]
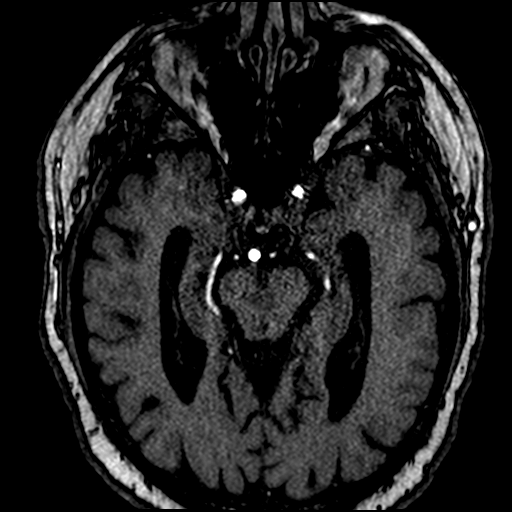
[im 172/181]
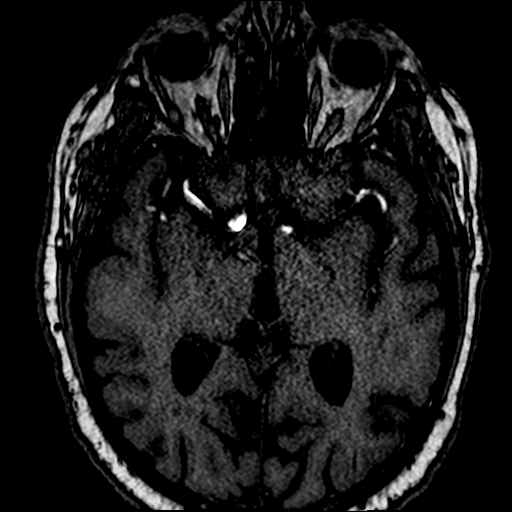

[Series 7: [id number]-[id number]_(person_name) · axial · 90.5mm · 0.35mm/px · 1 of 1 slices shown]
[im 1/1]
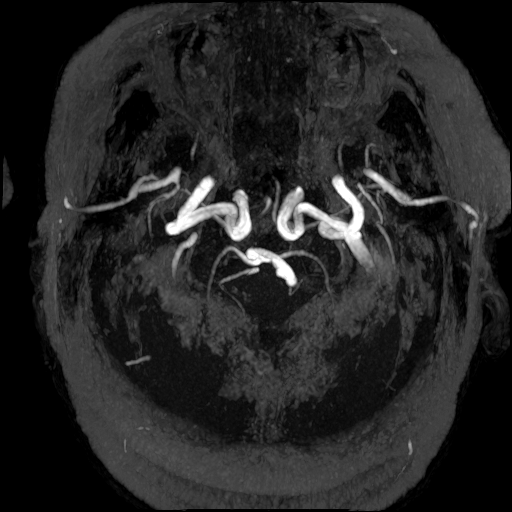

[Series 1024: post l-r · axial · 0.96mm/px · 1 of 1 slices shown]
[im 1/1]
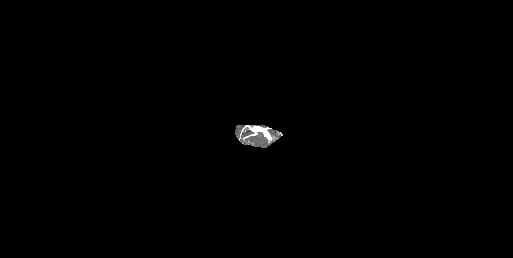

[Series 1040: lt l-r · axial · 0.96mm/px · 1 of 3 slices shown]
[im 1/3]
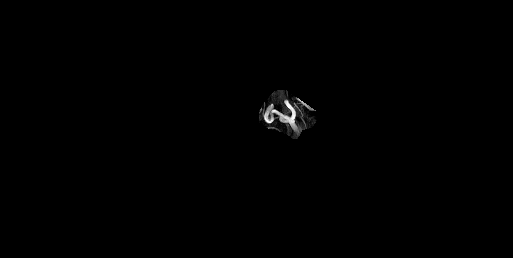

[Series 1054: rt l-r · axial · 0.96mm/px · 1 of 9 slices shown]
[im 1/9]
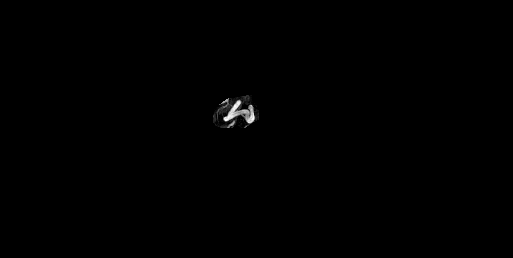

[20 of 48 positions shown; findings below may reference images not displayed]

FINDINGS: Right internal carotid artery is patent without any significant stenosis. The   
 visualized portion of the right middle cerebral arteries patent. The visualized A1        
 segment anterior cerebral arteries patent.                                                
 Left internal cerebral arteries patent. Visualized portion of the left middle cerebral    
 arteries patent. The visualized portion of the left A1 segment of the anterior cerebral   
 arteries patent. Redemonstrated is dominant left vertebral artery continuing as basilar   
 artery. Redemonstrated proximal basilar artery is noted similar to the previous.          
 Posterior cerebral arteries are patent.                                                   
 Majority of the anterior cerebral arteries are not included in the field of view. The     
 visualized portion of the middle cerebral arteries are patent.
IMPRESSION: 1. Stable findings from the previous without any evidence of new occlusion or stenosis.   
 Dominant left vertebral artery. Fenestrated proximal basilar artery.

## 2022-12-09 ENCOUNTER — Emergency Department: Admit: 2022-12-09 | Payer: MEDICARE

## 2022-12-09 ENCOUNTER — Inpatient Hospital Stay: Admit: 2022-12-09 | Discharge: 2022-12-10 | Disposition: A | Discharge status: Home / Self Care | Payer: MEDICARE

## 2022-12-09 DIAGNOSIS — S0083XA Contusion of other part of head, initial encounter: Secondary | ICD-10-CM

## 2022-12-09 DIAGNOSIS — W19XXXA Unspecified fall, initial encounter: Secondary | ICD-10-CM

## 2022-12-09 LAB — CBC WITH AUTO DIFFERENTIAL
Basophils %: 0.7 %
Basophils Absolute: 0 10*3/uL (ref 0.0–0.2)
Eosinophils %: 2.3 %
Eosinophils Absolute: 0.1 10*3/uL (ref 0.0–0.7)
Hematocrit: 47.9 % (ref 42.0–52.0)
Hemoglobin: 15.9 g/dL (ref 14.0–18.0)
Lymphocytes %: 27 %
Lymphocytes Absolute: 1.6 10*3/uL (ref 1.0–4.8)
MCH: 31.4 pg — ABNORMAL HIGH (ref 27.0–31.3)
MCHC: 33.2 % (ref 33.0–37.0)
MCV: 94.7 fL — ABNORMAL HIGH (ref 79.0–92.2)
Monocytes %: 8.3 %
Monocytes Absolute: 0.5 10*3/uL (ref 0.2–0.8)
Neutrophils %: 61.2 %
Neutrophils Absolute: 3.7 10*3/uL (ref 1.4–6.5)
Platelets: 146 10*3/uL (ref 130–400)
RBC: 5.06 M/uL (ref 4.70–6.10)
RDW: 14.3 % (ref 11.5–14.5)
WBC: 6 10*3/uL (ref 4.8–10.8)

## 2022-12-09 LAB — MAGNESIUM: Magnesium: 1.6 mg/dL — ABNORMAL LOW (ref 1.7–2.4)

## 2022-12-09 LAB — COMPREHENSIVE METABOLIC PANEL
ALT: 9 U/L (ref 0–41)
AST: 32 U/L (ref 0–40)
Albumin: 3.5 g/dL (ref 3.5–4.6)
Alkaline Phosphatase: 96 U/L (ref 35–104)
Anion Gap: 12 mEq/L (ref 9–15)
BUN: 29 mg/dL — ABNORMAL HIGH (ref 8–23)
CO2: 22 mEq/L (ref 20–31)
Calcium: 8 mg/dL — ABNORMAL LOW (ref 8.5–9.9)
Chloride: 105 mEq/L (ref 95–107)
Creatinine: 1.92 mg/dL — ABNORMAL HIGH (ref 0.70–1.20)
Est, Glom Filt Rate: 34.2 — ABNORMAL LOW (ref >60)
Globulin: 2.6 g/dL (ref 2.3–3.5)
Glucose: 204 mg/dL — ABNORMAL HIGH (ref 70–99)
Potassium: 4.1 mEq/L (ref 3.4–4.9)
Sodium: 139 mEq/L (ref 135–144)
Total Bilirubin: 0.4 mg/dL (ref 0.2–0.7)
Total Protein: 6.1 g/dL — ABNORMAL LOW (ref 6.3–8.0)

## 2022-12-09 LAB — URINALYSIS WITH REFLEX TO CULTURE
Bilirubin, Urine: NEGATIVE
Blood, Urine: NEGATIVE
Glucose, Ur: NEGATIVE mg/dL
Ketones, Urine: NEGATIVE mg/dL
Leukocyte Esterase, Urine: NEGATIVE
Nitrite, Urine: NEGATIVE
Protein, UA: >=300 mg/dL — AB
Specific Gravity, UA: 1.021 (ref 1.005–1.030)
Urobilinogen, Urine: 0.2 E.U./dL (ref <2.0)
pH, Urine: 5.5 (ref 5.0–9.0)

## 2022-12-09 LAB — MICROSCOPIC URINALYSIS: Bacteria, UA: NEGATIVE /HPF

## 2022-12-09 LAB — ETHANOL: Ethanol Lvl: <10 mg/dL

## 2022-12-09 LAB — APTT: aPTT: 30.1 s (ref 24.4–36.8)

## 2022-12-09 LAB — PROTIME-INR
INR: 1.1
Protime: 13.9 s (ref 12.3–14.9)

## 2022-12-09 MED ORDER — SODIUM CHLORIDE 0.9 % IV BOLUS
0.9 | Freq: Once | INTRAVENOUS | Status: AC
Start: 2022-12-09 — End: 2022-12-09
  Administered 2022-12-09: 23:00:00 500 mL via INTRAVENOUS

## 2022-12-09 MED ORDER — AZITHROMYCIN 250 MG PO TABS
250 | ORAL_TABLET | ORAL | 0 refills | Status: AC
Start: 2022-12-09 — End: 2022-12-19

## 2022-12-09 MED ORDER — SODIUM CHLORIDE 0.9 % IV BOLUS
0.9 | Freq: Once | INTRAVENOUS | Status: AC
Start: 2022-12-09 — End: 2022-12-09
  Administered 2022-12-09: 21:00:00 500 mL via INTRAVENOUS

## 2022-12-09 MED FILL — SODIUM CHLORIDE 0.9 % IV SOLN: 0.9 % | INTRAVENOUS | Qty: 500

## 2022-12-09 NOTE — ED Notes (Signed)
Orthostatic vs done, pt stable, 0 pain, 0 dizziness, Geoff pa updated on vital signs, 0 new orders.  Pt a&ox4, skin w/d/pink.

## 2022-12-09 NOTE — ED Notes (Signed)
C. COLLAR OFF BY GEOFF PA, 0 C/O, 0 DISTRESS.

## 2022-12-09 NOTE — ED Notes (Signed)
Pt resting in bed, a&ox4, skin w/d/pink, 0 c/o, 0 dizziness, 0 pain, family at bedside.

## 2022-12-09 NOTE — Discharge Instructions (Addendum)
Follow-up with primary care physician return to ER if any symptoms worsen or new symptoms develop.  Increase fluid intake as discussed.

## 2022-12-09 NOTE — ED Notes (Signed)
Pt stable, a&ox4, skin w/d/pink, 0 pain, 0 distress, pt out from ed via w/c, going home with wife, 0 problems.

## 2022-12-09 NOTE — ED Notes (Signed)
Orthostatic vs obt, pt + , 0 dizziness reported, Geoff pa notified and at bedside talking to pt.

## 2022-12-09 NOTE — ED Provider Notes (Signed)
[facility] ED  EMERGENCY DEPARTMENT ENCOUNTER      Pt Name: Jeffery Dyer  MRN: [id number]  Birthdate [birth year]  Date of evaluation: 12/09/2022  Provider: Milderd Meager, PA-C  3:09 PM EDT    CHIEF COMPLAINT       Chief Complaint   Patient presents with    Fall     C/o fall from standing, hit head, 0 loc         HISTORY OF PRESENT ILLNESS   (Location/Symptom, Timing/Onset, Context/Setting, Quality, Duration, Modifying Factors, Severity)  Note limiting factors.   [name] is a 82 y.o. male who presents to the emergency department patient had fall from standing hit right side of head face has headache right shoulder pain unknown LOC.  Patient's wife and family at bedside states they are and route to [facility] clinic to evaluate patient for being unsteady on his feet and patient report does have dementia.  Patient denies any difficulty seeing hearing speaking chest pain abdominal pain back pain arm pain leg pain.  Patient takes aspirin and Plavix.  Symptoms moderate severity worse with touch or motion    HPI    Nursing Notes were reviewed.    REVIEW OF SYSTEMS    (2-9 systems for level 4, 10 or more for level 5)     Review of Systems   Constitutional:  Negative for activity change, appetite change, fever and unexpected weight change.   HENT:  Negative for ear discharge, nosebleeds and voice change.    Eyes:  Negative for discharge.   Respiratory:  Negative for cough and shortness of breath.    Cardiovascular:  Negative for chest pain.   Gastrointestinal:  Negative for abdominal distention, abdominal pain, blood in stool, nausea and vomiting.   Genitourinary:  Negative for dysuria.   Musculoskeletal:  Positive for arthralgias. Negative for back pain, joint swelling, neck pain and neck stiffness.   Skin:  Negative for pallor.   Neurological:  Positive for headaches. Negative for seizures and facial asymmetry.   Hematological:  Does not bruise/bleed easily.   Psychiatric/Behavioral:  Positive for sleep  disturbance. Negative for behavioral problems.    All other systems reviewed and are negative.      Except as noted above the remainder of the review of systems was reviewed and negative.       PAST MEDICAL HISTORY     Past Medical History:   Diagnosis Date    CAD (coronary artery disease)     Cancer (HCC)     skin cancer face    Cerebral artery occlusion with cerebral infarction (HCC)     Chronic kidney disease     Dementia (HCC)     Diabetes mellitus (HCC)     Hyperlipidemia     Hypertension     Kidney stone     MI (myocardial infarction) (HCC)     Parkinson disease (HCC)     Thyroid disease     TIA (transient ischemic attack)          SURGICAL HISTORY       Past Surgical History:   Procedure Laterality Date    CORONARY ANGIOPLASTY WITH STENT PLACEMENT      x 1 stent    KNEE ARTHROPLASTY Bilateral     partial replacement    SHOULDER SURGERY Right          CURRENT MEDICATIONS       Discharge Medication List as of 12/09/2022  8:26 PM  ALLERGIES     Penicillins    FAMILY HISTORY     History reviewed. No pertinent family history.       SOCIAL HISTORY       Social History     Socioeconomic History    Marital status: Married     Spouse name: None    Number of children: None    Years of education: None    Highest education level: None   Tobacco Use    Smoking status: Never     Passive exposure: Never    Smokeless tobacco: Never   Vaping Use    Vaping Use: Never used   Substance and Sexual Activity    Alcohol use: Never    Drug use: Never    Sexual activity: Never       SCREENINGS         Glasgow Coma Scale  Eye Opening: Spontaneous  Best Verbal Response: Oriented  Best Motor Response: Obeys commands  Glasgow Coma Scale Score: 15                     CIWA Assessment  BP: (!) 140/79  Pulse: 52                 PHYSICAL EXAM    (up to 7 for level 4, 8 or more for level 5)     ED Triage Vitals [12/09/22 1438]   BP Temp Temp Source Pulse Respirations SpO2 Height Weight - Scale   121/89 97.5 F (36.4 C) Oral 60 16 95 %  1.778 m (5\' 10" ) 78.9 kg (174 lb)       Physical Exam  Vitals and nursing note reviewed.   Constitutional:       General: He is not in acute distress.     Appearance: He is well-developed.   HENT:      Head: Normocephalic.      Comments: Contusion to right cheek slight contusion to scalp right side     Right Ear: Tympanic membrane and external ear normal.      Left Ear: Tympanic membrane and external ear normal.      Nose: Nose normal.      Mouth/Throat:      Mouth: Mucous membranes are moist.   Eyes:      General:         Right eye: No discharge.         Left eye: No discharge.      Pupils: Pupils are equal, round, and reactive to light.   Neck:      Comments: Patient is in c-collar tenderness noted left side paraspinal.  Cardiovascular:      Rate and Rhythm: Normal rate and regular rhythm.      Pulses: Normal pulses.      Heart sounds: Normal heart sounds.   Pulmonary:      Effort: Pulmonary effort is normal. No respiratory distress.      Breath sounds: Normal breath sounds. No stridor. No wheezing, rhonchi or rales.   Chest:      Chest wall: No tenderness.   Abdominal:      General: Bowel sounds are normal. There is no distension.      Palpations: Abdomen is soft.      Tenderness: There is no abdominal tenderness. There is no right CVA tenderness or left CVA tenderness.   Musculoskeletal:         General: Normal range of motion.  Cervical back: Tenderness present.      Comments: Positive right shoulder tenderness    Negative thoracolumbar tenderness negative paraspinal tenderness negative lower extremity tenderness including hips sensation range of motion distally.   Skin:     General: Skin is warm.      Findings: No erythema.   Neurological:      Mental Status: He is alert and oriented to person, place, and time.      Motor: No weakness.   Psychiatric:         Mood and Affect: Mood normal.         DIAGNOSTIC RESULTS     EKG: All EKG's are interpreted by the Emergency Department Physician who either signs or  Co-signs this chart in the absence of a cardiologist.         RADIOLOGY:   Non-plain film images such as CT, Ultrasound and MRI are read by the radiologist. Plain radiographic images are visualized and preliminarily interpreted by the emergency physician with the below findings:         Interpretation per the Radiologist below, if available at the time of this note:    XR SHOULDER RIGHT (MIN 2 VIEWS)   Final Result   No acute fracture visualized radiographically.         CT Head W/O Contrast   Final Result   No acute intracranial abnormality.      Mild chronic senescent change      Mild changes of possible acute sinusitis         CT CSpine W/O Contrast   Final Result   No acute fracture         CT FACIAL BONES WO CONTRAST   Final Result   1. No acute facial bone fracture.   2. Mild mucosal thickening in the right maxillary sinus with mild bubbly   secretions.  This can be seen with acute sinusitis   3. Dense calcifications in distal left vertebral artery               ED BEDSIDE ULTRASOUND:   Performed by ED Physician - none    LABS:  Labs Reviewed   CBC WITH AUTO DIFFERENTIAL - Abnormal; Notable for the following components:       Result Value    MCV 94.7 (*)     MCH 31.4 (*)     All other components within normal limits   COMPREHENSIVE METABOLIC PANEL - Abnormal; Notable for the following components:    Glucose 204 (*)     BUN 29 (*)     Creatinine 1.92 (*)     Est, Glom Filt Rate 34.2 (*)     Calcium 8.0 (*)     Total Protein 6.1 (*)     All other components within normal limits   URINALYSIS WITH REFLEX TO CULTURE - Abnormal; Notable for the following components:    Protein, UA >=300 (*)     All other components within normal limits   MAGNESIUM - Abnormal; Notable for the following components:    Magnesium 1.6 (*)     All other components within normal limits   PROTIME-INR   APTT   ETHANOL   MICROSCOPIC URINALYSIS       All other labs were within normal range or not returned as of this dictation.    EMERGENCY  DEPARTMENT COURSE and DIFFERENTIAL DIAGNOSIS/MDM:   Vitals:    Vitals:    12/09/22 1811 12/09/22 1822 12/09/22 1900 12/09/22  2009   BP:  (!) 184/82 137/88 (!) 140/79   Pulse:  54 54 52   Resp: 16 16 12 15    Temp:       TempSrc:       SpO2: 96% 98% 96% 96%   Weight:       Height:                Medical Decision Making  presents to the emergency department patient had fall from standing hit right side of head face has headache right shoulder pain unknown LOC.  Patient's wife and family at bedside states they are and route to [facility] clinic to evaluate patient for being unsteady on his feet and patient report does have dementia.  Patient denies any difficulty seeing hearing speaking chest pain abdominal pain back pain arm pain leg pain.  Patient takes aspirin and Plavix.  Symptoms moderate severity worse with touch or motion.    Differential diagnosis includes closed head injury facial contusion scalp contusion cervical fracture right shoulder fracture.    Will add CT head facial bones and cervical.  As patient has contusions of head and face and tenderness overlying left paracervical and cervical tenderness.  Trauma labs including CBC CMP PT PTT will also obtain urinalysis as patient had a fall.    Negative CT for head injury with exception of acute sinusitis family notes patient's had runny nose recently.  Has negative tenderness will treat with Zithromax as patient has penicillin allergy.    Patient will be orthostatic hypotensive and we added 500 mL of fluid repeated orthostatics patient while his symptoms have improved is still hypotensive on standing add additional 500 mL bolus given.  Patient has improved now has no symptoms repeated vital signs have improved patient and family want discharged home they are going to follow-up with primary care we have asked him to hold propranolol as patient did have bradycardia noted intermittently throughout visit.  He is agreeable with plan of care Z-Pak prescription  written for disposition to home return to if any symptoms worsen or new symptoms develop.          Amount and/or Complexity of Data Reviewed  Labs: ordered. Decision-making details documented in ED Course.  Radiology: ordered.  ECG/medicine tests: ordered and independent interpretation performed. Decision-making details documented in ED Course.    Risk  Prescription drug management.            REASSESSMENT     ED Course as of 12/09/22 2200   Wed Dec 09, 2022   1554 BUN,BUNPL(!): 29 [GR]   1555 Creatinine(!): 1.92 [GR]   1555 Magnesium(!): 1.6 [GR]   1555 WBC: 6.0 [GR]   1555 Hemoglobin Quant: 15.9 [GR]   1555 Urinalysis with Reflex to Culture [GR]   1701 Patient's blood pressure 88/62 on standing patient became dizzy will add fluids patient does not take blood pressure medication also has not been drinking enough fluids recently per [GR]   2159 EKG sinus bradycardia rate 54 first-degree AV block with 360 ms PR interval negative ST segment elevation [GR]      ED Course User Index  [GR] Milderd Meager, PA-C             CONSULTS:  None    PROCEDURES:  Unless otherwise noted below, none     Procedures         FINAL IMPRESSION      1. Fall, initial encounter    2. Injury of head, initial  encounter    3. Neck pain    4. Injury of right shoulder, initial encounter    5. Acute maxillary sinusitis, recurrence not specified    6. Orthostatic hypotension          DISPOSITION/PLAN   DISPOSITION Decision To Discharge 12/09/2022 08:27:33 PM      PATIENT REFERRED TO:  Your primary care physician    Call in 1 day      [facility] ED  [address]  [place] [address]  [phone]  Go to   If symptoms worsen      DISCHARGE MEDICATIONS:  Discharge Medication List as of 12/09/2022  8:26 PM        START taking these medications    Details   azithromycin (ZITHROMAX) 250 MG tablet 2 TABS DAY 1 THEN 1 TAB DAYS 2-5, Disp-6 tablet, R-0Print           Controlled Substances Monitoring:          No data to display                 (Please note that portions of this note were completed with a voice recognition program.  Efforts were made to edit the dictations but occasionally words are mis-transcribed.)    Milderd Meager, PA-C (electronically signed)  Attending Emergency Physician    Supervising Attending Physician: Dr. Edward Qualia.      Milderd Meager, PA-C  12/09/22 2028       Milderd Meager, PA-C  12/09/22 2200

## 2022-12-09 NOTE — ED Notes (Signed)
SLING TO PT'S RT UPPER EXT. PER ORDER, PT TOL. WELL. SKIN W/D/PINK, PULSES PALP, CAP. REFILL BRISK, 0 NUMBNESS, 0 TINGLING.

## 2022-12-09 NOTE — ED Notes (Signed)
Per pa Geoff order, pt feed, tol. Well.

## 2022-12-09 NOTE — ED Notes (Signed)
Pt stable, a&ox4, skin w/d/pink, 0 distress, 0 pain, pt out from ed via w/c, 0 c/o, 0 distress.

## 2022-12-09 NOTE — ED Notes (Signed)
Geoff pa at bedside.

## 2022-12-09 NOTE — ED Triage Notes (Signed)
Per report pt fell from standing, hit rt side of head and rt shoulder during the fall, 0 loc reported, pt a&ox3 on arrival, skin w/d/pink, c. Collar in place by [facility], pt denies any pain at this time.

## 2022-12-09 NOTE — ED Notes (Signed)
Patient tried to ambulate and got very week in the legs      Dr Leotis Shames will keep patient for further observation                            dr

## 2022-12-10 LAB — EKG 12-LEAD
Atrial Rate: 54 {beats}/min
P Axis: 60 degrees
P-R Interval: 316 ms
Q-T Interval: 472 ms
QRS Duration: 100 ms
QTc Calculation (Bazett): 447 ms
R Axis: -62 degrees
T Axis: 19 degrees
Ventricular Rate: 54 {beats}/min

## 2023-06-11 DEATH — deceased
